# Patient Record
Sex: Female | Born: 1985 | Race: White | Hispanic: No | Smoking: Current every day smoker
Health system: Southern US, Community
[De-identification: ages and names within clinical notes are randomized; demographics above are authoritative.]

## PROBLEM LIST (undated history)

## (undated) DIAGNOSIS — E7800 Pure hypercholesterolemia, unspecified: Secondary | ICD-10-CM

## (undated) DIAGNOSIS — F32A Depression, unspecified: Secondary | ICD-10-CM

## (undated) DIAGNOSIS — F419 Anxiety disorder, unspecified: Secondary | ICD-10-CM

## (undated) DIAGNOSIS — G43909 Migraine, unspecified, not intractable, without status migrainosus: Secondary | ICD-10-CM

## (undated) DIAGNOSIS — R251 Tremor, unspecified: Secondary | ICD-10-CM

## (undated) HISTORY — DX: Migraine, unspecified, not intractable, without status migrainosus: G43.909

## (undated) HISTORY — DX: Anxiety disorder, unspecified: F41.9

## (undated) HISTORY — PX: TUBAL LIGATION: SHX77

## (undated) HISTORY — DX: Pure hypercholesterolemia, unspecified: E78.00

## (undated) HISTORY — DX: Depression, unspecified: F32.A

## (undated) HISTORY — PX: TONSILLECTOMY: SUR1361

## (undated) HISTORY — DX: Tremor, unspecified: R25.1

## (undated) HISTORY — PX: CHOLECYSTECTOMY: SHX55

---
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
## 2010-01-14 ENCOUNTER — Observation Stay (HOSPITAL_COMMUNITY): Admission: EM | Admit: 2010-01-14 | Discharge: 2010-01-14 | Payer: Self-pay | Admitting: Emergency Medicine

## 2010-01-22 ENCOUNTER — Emergency Department (HOSPITAL_COMMUNITY): Admission: EM | Admit: 2010-01-22 | Discharge: 2009-12-23 | Payer: Self-pay | Admitting: Emergency Medicine

## 2010-04-28 LAB — COMPREHENSIVE METABOLIC PANEL
Albumin: 2.8 g/dL — ABNORMAL LOW (ref 3.5–5.2)
Alkaline Phosphatase: 52 U/L (ref 39–117)
BUN: 5 mg/dL — ABNORMAL LOW (ref 6–23)
Creatinine, Ser: 0.85 mg/dL (ref 0.4–1.2)
GFR calc Af Amer: >60 mL/min (ref >60)
Potassium: 3.6 mEq/L (ref 3.5–5.1)
Total Protein: 6.8 g/dL (ref 6.0–8.3)

## 2010-04-28 LAB — URINE CULTURE
Colony Count: NO GROWTH
Culture  Setup Time: 201111301700
Culture: NO GROWTH

## 2010-04-28 LAB — URINALYSIS, ROUTINE W REFLEX MICROSCOPIC
Hgb urine dipstick: NEGATIVE
Nitrite: NEGATIVE
Protein, ur: 30 mg/dL — AB
Specific Gravity, Urine: 1.02 (ref 1.005–1.030)
Urobilinogen, UA: 1 mg/dL (ref 0.0–1.0)

## 2010-04-28 LAB — URINE MICROSCOPIC-ADD ON

## 2010-04-28 LAB — CBC
MCV: 88.5 fL (ref 78.0–100.0)
Platelets: 155 10*3/uL (ref 150–400)
RDW: 13.5 % (ref 11.5–15.5)
WBC: 9.6 10*3/uL (ref 4.0–10.5)

## 2010-04-28 LAB — DIFFERENTIAL
Basophils Relative: 4 % — ABNORMAL HIGH (ref 0–1)
Eosinophils Relative: 0 % (ref 0–5)
Lymphocytes Relative: 61 % — ABNORMAL HIGH (ref 12–46)
Neutrophils Relative %: 23 % — ABNORMAL LOW (ref 43–77)

## 2010-04-28 LAB — POCT PREGNANCY, URINE
Preg Test, Ur: NEGATIVE
Preg Test, Ur: NEGATIVE

## 2010-08-11 ENCOUNTER — Emergency Department (HOSPITAL_COMMUNITY)
Admission: EM | Admit: 2010-08-11 | Discharge: 2010-08-12 | Disposition: A | Discharge status: Home / Self Care | Payer: Self-pay | Attending: Emergency Medicine | Admitting: Emergency Medicine

## 2010-08-11 DIAGNOSIS — N949 Unspecified condition associated with female genital organs and menstrual cycle: Secondary | ICD-10-CM | POA: Insufficient documentation

## 2010-08-11 DIAGNOSIS — N938 Other specified abnormal uterine and vaginal bleeding: Secondary | ICD-10-CM | POA: Insufficient documentation

## 2010-08-11 DIAGNOSIS — Z9889 Other specified postprocedural states: Secondary | ICD-10-CM | POA: Insufficient documentation

## 2010-08-11 DIAGNOSIS — R109 Unspecified abdominal pain: Secondary | ICD-10-CM | POA: Insufficient documentation

## 2010-08-11 DIAGNOSIS — Z9089 Acquired absence of other organs: Secondary | ICD-10-CM | POA: Insufficient documentation

## 2010-08-11 LAB — WET PREP, GENITAL: Yeast Wet Prep HPF POC: NONE SEEN

## 2010-08-11 LAB — DIFFERENTIAL
Basophils Absolute: 0 K/uL (ref 0.0–0.1)
Basophils Relative: 1 % (ref 0–1)
Eosinophils Absolute: 0.2 K/uL (ref 0.0–0.7)
Eosinophils Relative: 3 % (ref 0–5)
Lymphocytes Relative: 48 % — ABNORMAL HIGH (ref 12–46)
Lymphs Abs: 3.1 K/uL (ref 0.7–4.0)
Monocytes Absolute: 0.4 K/uL (ref 0.1–1.0)
Monocytes Relative: 6 % (ref 3–12)
Neutro Abs: 2.8 K/uL (ref 1.7–7.7)
Neutrophils Relative %: 42 % — ABNORMAL LOW (ref 43–77)

## 2010-08-11 LAB — CBC
MCH: 31 pg (ref 26.0–34.0)
Platelets: 202 10*3/uL (ref 150–400)
RBC: 3.9 MIL/uL (ref 3.87–5.11)

## 2010-08-12 LAB — URINALYSIS, ROUTINE W REFLEX MICROSCOPIC
Bilirubin Urine: NEGATIVE
Specific Gravity, Urine: 1.012 (ref 1.005–1.030)
Urobilinogen, UA: 0.2 mg/dL (ref 0.0–1.0)
pH: 7 (ref 5.0–8.0)

## 2010-08-12 LAB — URINE MICROSCOPIC-ADD ON

## 2010-08-12 LAB — GC/CHLAMYDIA PROBE AMP, GENITAL
Chlamydia, DNA Probe: NEGATIVE
GC Probe Amp, Genital: NEGATIVE

## 2011-01-22 ENCOUNTER — Encounter: Payer: Self-pay | Admitting: Emergency Medicine

## 2011-01-22 ENCOUNTER — Emergency Department (HOSPITAL_COMMUNITY)
Admission: EM | Admit: 2011-01-22 | Discharge: 2011-01-22 | Disposition: A | Discharge status: Home / Self Care | Payer: Self-pay | Attending: Emergency Medicine | Admitting: Emergency Medicine

## 2011-01-22 ENCOUNTER — Emergency Department (HOSPITAL_COMMUNITY): Payer: Self-pay

## 2011-01-22 DIAGNOSIS — F17200 Nicotine dependence, unspecified, uncomplicated: Secondary | ICD-10-CM | POA: Insufficient documentation

## 2011-01-22 DIAGNOSIS — J111 Influenza due to unidentified influenza virus with other respiratory manifestations: Secondary | ICD-10-CM | POA: Insufficient documentation

## 2011-01-22 MED ORDER — ALBUTEROL SULFATE HFA 108 (90 BASE) MCG/ACT IN AERS
2.0000 | INHALATION_SPRAY | RESPIRATORY_TRACT | Status: DC | PRN
  Administered 2011-01-22: 2 via RESPIRATORY_TRACT
  Filled 2011-01-22: qty 6.7

## 2011-01-22 NOTE — ED Notes (Signed)
Pt presenting to ed with c/o flu like symptoms x 1 week with positive nausea and vomiting pt states she woke up with a fever this am

## 2011-01-22 NOTE — ED Provider Notes (Signed)
History     CSN: [id number] Arrival date & time: 01/22/2011  6:28 PM   First MD Initiated Contact with Patient 01/22/11 2033      Chief Complaint  Patient presents with  . Influenza    (Consider location/radiation/quality/duration/timing/severity/associated sxs/prior treatment) Patient is a 25 y.o. female presenting with flu symptoms and cough. The history is provided by the patient.  Influenza This is a new problem. The current episode started more than 1 week ago. The problem occurs constantly. The problem has been gradually worsening. Associated symptoms include chest pain and headaches. Pertinent negatives include no abdominal pain and no shortness of breath. Associated symptoms comments: Wheezing, fever. The symptoms are aggravated by nothing. The symptoms are relieved by nothing. She has tried acetaminophen for the symptoms. The treatment provided no relief.  Cough This is a new problem. The current episode started more than 1 week ago. The problem occurs constantly. The problem has not changed since onset.The cough is productive of sputum. The maximum temperature recorded prior to her arrival was 102 to 102.9 F. The fever has been present for 1 to 2 days. Associated symptoms include chest pain, chills, sweats, headaches, rhinorrhea, sore throat, myalgias and wheezing. Pertinent negatives include no ear pain and no shortness of breath. She has tried decongestants and cough syrup for the symptoms. The treatment provided no relief. She is a smoker. Her past medical history does not include COPD or asthma.    History reviewed. No pertinent past medical history.  Past Surgical History  Procedure Date  . Cholecystectomy   . Tubal ligation     No family history on file.  History  Substance Use Topics  . Smoking status: Current Everyday Smoker -- 1.0 packs/day  . Smokeless tobacco: Not on file  . Alcohol Use: Yes     occasionally    OB History    Grav Para Term Preterm  Abortions TAB SAB Ect Mult Living                  Review of Systems  Constitutional: Positive for chills.  HENT: Positive for sore throat and rhinorrhea. Negative for ear pain.   Respiratory: Positive for cough and wheezing. Negative for shortness of breath.   Cardiovascular: Positive for chest pain.  Gastrointestinal: Negative for abdominal pain.  Musculoskeletal: Positive for myalgias.  Neurological: Positive for headaches.  All other systems reviewed and are negative.    Allergies  Review of patient's allergies indicates no known allergies.  Home Medications   Current Outpatient Rx  Name Route Sig Dispense Refill  . AMITRIPTYLINE HCL 25 MG PO TABS Oral Take 25 mg by mouth at bedtime as needed. FOR SLEEP       BP 115/89  Pulse 91  Temp(Src) 99.7 F (37.6 C) (Oral)  Resp 20  SpO2 100%  LMP 01/08/2011  Physical Exam  Nursing note and vitals reviewed. Constitutional: She is oriented to person, place, and time. She appears well-developed and well-nourished. No distress.  HENT:  Head: Normocephalic and atraumatic.  Right Ear: Tympanic membrane, external ear and ear canal normal.  Left Ear: Tympanic membrane, external ear and ear canal normal.  Nose: Mucosal edema and rhinorrhea present.  Mouth/Throat: Posterior oropharyngeal erythema present. No oropharyngeal exudate or tonsillar abscesses.  Eyes: EOM are normal. Pupils are equal, round, and reactive to light.  Neck: Normal range of motion. Neck supple.  Cardiovascular: Normal rate, regular rhythm, normal heart sounds and intact distal pulses.  Exam reveals no friction  rub.   No murmur heard. Pulmonary/Chest: Effort normal. She has no wheezes. She has rhonchi in the right middle field and the left middle field. She has no rales. She exhibits tenderness.  Abdominal: Soft. Bowel sounds are normal. She exhibits no distension. There is no tenderness. There is no rebound and no guarding.  Musculoskeletal: Normal range of  motion. She exhibits no tenderness.       No edema  Neurological: She is alert and oriented to person, place, and time. No cranial nerve deficit.  Skin: Skin is warm and dry. No rash noted.  Psychiatric: She has a normal mood and affect. Her behavior is normal.    ED Course  Procedures (including critical care time)  Labs Reviewed - No data to display Dg Chest 2 View  01/22/2011  *RADIOLOGY REPORT*  Clinical Data: Cough, fever  CHEST - 2 VIEW  Comparison: 01/14/2010  Findings: Lungs are clear. No pleural effusion or pneumothorax.  Cardiomediastinal silhouette is within normal limits.  Visualized osseous structures are within normal limits.  Cholecystectomy clips.  IMPRESSION: Normal chest radiographs.  Original Report Authenticated By: Charline Bills, M.D.     No diagnosis found.    MDM   Pt with symptoms consistent with influenza.  Normal exam here but is febrile.  No signs of breathing difficulty  No signs of strep pharyngitis, otitis or abnormal abdominal findings.  States wheezing at night and is a smoker.   CXR wnl.  Will continue antipyretica and rest and fluids and return for any further problems.  Will give inhaler for coughing and wheezing.         Gwyneth Sprout, MD 01/22/11 2159

## 2011-10-30 ENCOUNTER — Encounter (HOSPITAL_COMMUNITY): Payer: Self-pay | Admitting: Emergency Medicine

## 2011-10-30 ENCOUNTER — Emergency Department (HOSPITAL_COMMUNITY)
Admission: EM | Admit: 2011-10-30 | Discharge: 2011-10-30 | Disposition: A | Discharge status: Home / Self Care | Payer: Self-pay | Attending: Emergency Medicine | Admitting: Emergency Medicine

## 2011-10-30 DIAGNOSIS — G43909 Migraine, unspecified, not intractable, without status migrainosus: Secondary | ICD-10-CM

## 2011-10-30 DIAGNOSIS — F17200 Nicotine dependence, unspecified, uncomplicated: Secondary | ICD-10-CM | POA: Insufficient documentation

## 2011-10-30 MED ORDER — KETOROLAC TROMETHAMINE 60 MG/2ML IM SOLN
60.0000 mg | Freq: Once | INTRAMUSCULAR | Status: AC
  Administered 2011-10-30: 60 mg via INTRAMUSCULAR
  Filled 2011-10-30: qty 2

## 2011-10-30 MED ORDER — METOCLOPRAMIDE HCL 5 MG/ML IJ SOLN
10.0000 mg | Freq: Once | INTRAMUSCULAR | Status: AC
  Administered 2011-10-30: 10 mg via INTRAMUSCULAR
  Filled 2011-10-30: qty 2

## 2011-10-30 MED ORDER — DEXAMETHASONE SODIUM PHOSPHATE 10 MG/ML IJ SOLN
10.0000 mg | Freq: Once | INTRAMUSCULAR | Status: AC
  Administered 2011-10-30: 10 mg via INTRAMUSCULAR
  Filled 2011-10-30: qty 1

## 2011-10-30 MED ORDER — DIPHENHYDRAMINE HCL 50 MG/ML IJ SOLN
25.0000 mg | Freq: Once | INTRAMUSCULAR | Status: AC
  Administered 2011-10-30: 25 mg via INTRAMUSCULAR
  Filled 2011-10-30: qty 1

## 2011-10-30 NOTE — ED Notes (Signed)
Patient given discharge instructions, information, prescriptions, and diet order. Patient states that they adequately understand discharge information given and to return to ED if symptoms return or worsen.     

## 2011-10-30 NOTE — ED Notes (Signed)
Pt presents w/ migraine for previous week, sharp stabbing pain behind left eye. Rx/ed w/ Excedrin w/o relief. States has vision loss for 3 days during the week could see only white. spots

## 2011-10-30 NOTE — ED Notes (Signed)
Pt sts pain x 1 week with migraine, sts she get migraines every other week since 15. Pt sts she has no medication that works for her. Pain level 8/10. Pt seeing white spots in left eye at this time.

## 2011-10-30 NOTE — ED Provider Notes (Signed)
History     CSN: [id number]  Arrival date & time 10/30/11  1437   First MD Initiated Contact with Patient 10/30/11 1507      Chief Complaint  Patient presents with  . Migraine    (Consider location/radiation/quality/duration/timing/severity/associated sxs/prior treatment) Patient is a 26 y.o. female presenting with migraines. The history is provided by the patient.  Migraine   Pt with her typical migraine x 1 week--no fever or neck pain--pt using otc meds wihout relief--no change in gait, notes visual scotomas--pain worse with sound--no follow up  History reviewed. No pertinent past medical history.  Past Surgical History  Procedure Date  . Tubal ligation   . Cholecystectomy   . Tonsillectomy     No family history on file.  History  Substance Use Topics  . Smoking status: Current Every Day Smoker -- 1.0 packs/day    Types: Cigarettes  . Smokeless tobacco: Never Used  . Alcohol Use:     OB History    Grav Para Term Preterm Abortions TAB SAB Ect Mult Living                  Review of Systems  All other systems reviewed and are negative.    Allergies  Review of patient's allergies indicates no known allergies.  Home Medications   Current Outpatient Rx  Name Route Sig Dispense Refill  . ASPIRIN-ACETAMINOPHEN-CAFFEINE 250-250-65 MG PO TABS Oral Take 1 tablet by mouth every 6 (six) hours as needed. Migraine    . RA GUMMY VITAMINS & MINERALS PO CHEW Oral Chew 1 tablet by mouth daily.      BP 106/48  Pulse 70  Temp 97.8 F (36.6 C) (Oral)  Resp 16  Ht 6' (1.829 m)  SpO2 100%  LMP 10/17/2011  Physical Exam  Nursing note and vitals reviewed. Constitutional: She is oriented to person, place, and time. She appears well-developed and well-nourished.  Non-toxic appearance. No distress.  HENT:  Head: Normocephalic and atraumatic.  Eyes: Conjunctivae normal, EOM and lids are normal. Pupils are equal, round, and reactive to light.  Neck: Normal range of  motion. Neck supple. No tracheal deviation present. No mass present.  Cardiovascular: Normal rate, regular rhythm and normal heart sounds.  Exam reveals no gallop.   No murmur heard. Pulmonary/Chest: Effort normal and breath sounds normal. No stridor. No respiratory distress. She has no decreased breath sounds. She has no wheezes. She has no rhonchi. She has no rales.  Abdominal: Soft. Normal appearance and bowel sounds are normal. She exhibits no distension. There is no tenderness. There is no rebound and no CVA tenderness.  Musculoskeletal: Normal range of motion. She exhibits no edema and no tenderness.  Neurological: She is alert and oriented to person, place, and time. She has normal strength. No cranial nerve deficit or sensory deficit. GCS eye subscore is 4. GCS verbal subscore is 5. GCS motor subscore is 6.  Skin: Skin is warm and dry. No abrasion and no rash noted.  Psychiatric: She has a normal mood and affect. Her speech is normal and behavior is normal.    ED Course  Procedures (including critical care time)  Labs Reviewed - No data to display No results found.   No diagnosis found.    MDM  Pt given meds for pain, and feels better--neuro exam stable        Toy Baker, MD 10/30/11 [phone]

## 2015-11-20 ENCOUNTER — Encounter (HOSPITAL_COMMUNITY): Payer: Self-pay | Admitting: Emergency Medicine

## 2018-05-15 ENCOUNTER — Encounter (HOSPITAL_COMMUNITY): Payer: Self-pay | Admitting: Emergency Medicine

## 2018-05-15 ENCOUNTER — Emergency Department (HOSPITAL_COMMUNITY): Payer: Self-pay

## 2018-05-15 ENCOUNTER — Other Ambulatory Visit: Payer: Self-pay

## 2018-05-15 ENCOUNTER — Emergency Department (HOSPITAL_COMMUNITY)
Admission: EM | Admit: 2018-05-15 | Discharge: 2018-05-15 | Disposition: A | Discharge status: Home / Self Care | Payer: Self-pay | Attending: Emergency Medicine | Admitting: Emergency Medicine

## 2018-05-15 DIAGNOSIS — W502XXA Accidental twist by another person, initial encounter: Secondary | ICD-10-CM | POA: Insufficient documentation

## 2018-05-15 DIAGNOSIS — Y9301 Activity, walking, marching and hiking: Secondary | ICD-10-CM | POA: Insufficient documentation

## 2018-05-15 DIAGNOSIS — Y92007 Garden or yard of unspecified non-institutional (private) residence as the place of occurrence of the external cause: Secondary | ICD-10-CM | POA: Insufficient documentation

## 2018-05-15 DIAGNOSIS — S99912A Unspecified injury of left ankle, initial encounter: Secondary | ICD-10-CM | POA: Insufficient documentation

## 2018-05-15 DIAGNOSIS — Y999 Unspecified external cause status: Secondary | ICD-10-CM | POA: Insufficient documentation

## 2018-05-15 DIAGNOSIS — F17210 Nicotine dependence, cigarettes, uncomplicated: Secondary | ICD-10-CM | POA: Insufficient documentation

## 2018-05-15 MED ORDER — OXYCODONE-ACETAMINOPHEN 5-325 MG PO TABS
1.0000 | ORAL_TABLET | Freq: Once | ORAL | Status: AC
  Administered 2018-05-15: 1 via ORAL
  Filled 2018-05-15: qty 1

## 2018-05-15 NOTE — ED Provider Notes (Signed)
[facility]-EMERGENCY DEPT Provider Note   CSN: [id number] Arrival date & time: 05/15/18  1637    History   Chief Complaint Chief Complaint  Patient presents with  . Foot Injury    HPI Tracy Garcia is a 33 y.o. female without significant past medical history, presenting to the emergency department with acute onset of left foot and ankle pain that began a few hours prior to arrival.  Patient states she rolled her ankle after stepping on a gumball  from a tree outside.  She has pain to the lateral aspect of her ankle and dorsal aspect of her foot.  Pain is worse with palpation and movement.  She elevated her foot and applied ice without any relief.  Did not take any over-the-counter medications for symptoms.  No previous injuries to this foot.     The history is provided by the patient.    History reviewed. No pertinent past medical history.  There are no active problems to display for this patient.   Past Surgical History:  Procedure Laterality Date  . CHOLECYSTECTOMY    . TONSILLECTOMY    . TUBAL LIGATION       OB History   No obstetric history on file.      Home Medications    Prior to Admission medications   Medication Sig Start Date End Date Taking? Authorizing Provider  Acetaminophen-Caff-Pyrilamine (MIDOL COMPLETE PO) Take 2 tablets by mouth daily as needed (menstrual cramps.).   Yes [provider]  aspirin-acetaminophen-caffeine (EXCEDRIN MIGRAINE) [phone] MG per tablet Take 1 tablet by mouth every 6 (six) hours as needed. Migraine   Yes [provider]    Family History No family history on file.  Social History Social History   Tobacco Use  . Smoking status: Current Every Day Smoker    Packs/day: 1.00    Types: Cigarettes  . Smokeless tobacco: Never Used  Substance Use Topics  . Alcohol use: Yes    Comment: occasionally  . Drug use: Yes    Types: Marijuana    Comment: not very often     Allergies    Patient has no known allergies.   Review of Systems Review of Systems  Musculoskeletal: Positive for arthralgias and joint swelling.  Skin: Negative for wound.     Physical Exam Updated Vital Signs BP 111/76 (BP Location: Right Arm)   Pulse 83   Temp 99 F (37.2 C) (Oral)   Resp 18   Ht 6' (1.829 m)   LMP 05/11/2018   SpO2 96%   Physical Exam Vitals signs and nursing note reviewed.  Constitutional:      General: She is not in acute distress.    Appearance: She is well-developed.  HENT:     Head: Normocephalic and atraumatic.  Eyes:     Conjunctiva/sclera: Conjunctivae normal.  Cardiovascular:     Rate and Rhythm: Normal rate.  Pulmonary:     Effort: Pulmonary effort is normal.  Musculoskeletal:     Comments: Left foot and ankle with mild swelling.  No obvious deformity or color changes.  No wounds.  Normal sensation.  There is tenderness to the lateral aspect of the foot, anterior to the lateral malleolus, as well as over the metatarsals aspect of the dorsal foot.  Pain with range of motion of the toes and ankle  Neurological:     Mental Status: She is alert.  Psychiatric:        Mood and Affect: Mood normal.  Behavior: Behavior normal.      ED Treatments / Results  Labs (all labs ordered are listed, but only abnormal results are displayed) Labs Reviewed - No data to display  EKG None  Radiology Dg Ankle Complete Left  Result Date: 05/15/2018 CLINICAL DATA:  33 year old female with trauma to the left ankle. EXAM: LEFT ANKLE COMPLETE - 3+ VIEW COMPARISON:  Left foot radiograph dated 05/15/2018 FINDINGS: There is no evidence of fracture, dislocation, or joint effusion. There is no evidence of arthropathy or other focal bone abnormality. Soft tissues are unremarkable. IMPRESSION: Negative. Electronically Signed   By: Elgie Collard M.D.   On: 05/15/2018 19:13   Dg Foot Complete Left  Result Date: 05/15/2018 CLINICAL DATA:  Fall. EXAM: LEFT FOOT -  COMPLETE 3+ VIEW COMPARISON:  None. FINDINGS: No acute fracture or dislocation. Joint spaces are preserved. Bone mineralization is normal. Dorsal forefoot soft tissue swelling. IMPRESSION: 1. Dorsal forefoot soft tissue swelling. No acute osseous abnormality. Electronically Signed   By: Obie Dredge M.D.   On: 05/15/2018 18:00    Procedures Procedures (including critical care time)  Medications Ordered in ED Medications  oxyCODONE-acetaminophen (PERCOCET/ROXICET) 5-325 MG per tablet 1 tablet (1 tablet Oral Given 05/15/18 1744)     Initial Impression / Assessment and Plan / ED Course  I have reviewed the triage vital signs and the nursing notes.  Pertinent labs & imaging results that were available during my care of the patient were reviewed by me and considered in my medical decision making (see chart for details).        Patient presenting with left foot and ankle pain after rolling it earlier today.  Some mild swelling and tenderness noted.  No obvious deformity.  X-ray of foot and ankle are negative for acute fracture.  Will treat as likely sprain.  ASO brace and crutches provided.  Discussed R ICE therapy, NSAIDs, nonweightbearing for at least 1 week.  Ortho referral provided for follow-up as needed.  Patient agreeable to plan and safe for discharge.  Discussed results, findings, treatment and follow up. Patient advised of return precautions. Patient verbalized understanding and agreed with plan.  Final Clinical Impressions(s) / ED Diagnoses   Final diagnoses:  Left ankle injury, initial encounter    ED Discharge Orders    None       Jospeh Mangel, [place] N, PA-C 05/15/18 2246    Tegeler, Canary Brim, MD 05/15/18 [phone]

## 2018-05-15 NOTE — Discharge Instructions (Signed)
Please read instructions below. Apply ice to your ankle for 20 minutes at a time. Keep it elevated as much as possible. After 1 week you can try to weight bear at tolerated.  You can take ibuprofen every 6 hours as needed for pain. Schedule an appointment with the orthopedic specialist in 1-2 weeks if your symptoms do not improve.  Return to the ER for new or concerning symptoms.

## 2018-05-15 NOTE — ED Triage Notes (Signed)
Pt reports that she was out helping neighbor with yard work when she slipped on tree gum balls and twisted and landed on her left foot. Pt had it elevated and iced at home for past two hours but pain and swelling continued.

## 2019-04-09 ENCOUNTER — Emergency Department (HOSPITAL_COMMUNITY): Payer: Self-pay

## 2019-04-09 ENCOUNTER — Emergency Department (HOSPITAL_COMMUNITY)
Admission: EM | Admit: 2019-04-09 | Discharge: 2019-04-09 | Disposition: A | Discharge status: Home / Self Care | Payer: Self-pay | Attending: Emergency Medicine | Admitting: Emergency Medicine

## 2019-04-09 ENCOUNTER — Other Ambulatory Visit: Payer: Self-pay

## 2019-04-09 ENCOUNTER — Encounter (HOSPITAL_COMMUNITY): Payer: Self-pay | Admitting: Emergency Medicine

## 2019-04-09 DIAGNOSIS — Z79899 Other long term (current) drug therapy: Secondary | ICD-10-CM | POA: Insufficient documentation

## 2019-04-09 DIAGNOSIS — M5441 Lumbago with sciatica, right side: Secondary | ICD-10-CM | POA: Insufficient documentation

## 2019-04-09 DIAGNOSIS — F17210 Nicotine dependence, cigarettes, uncomplicated: Secondary | ICD-10-CM | POA: Insufficient documentation

## 2019-04-09 DIAGNOSIS — Z8739 Personal history of other diseases of the musculoskeletal system and connective tissue: Secondary | ICD-10-CM | POA: Insufficient documentation

## 2019-04-09 DIAGNOSIS — M5440 Lumbago with sciatica, unspecified side: Secondary | ICD-10-CM

## 2019-04-09 MED ORDER — METHOCARBAMOL 500 MG PO TABS: 500.0000 mg | ORAL_TABLET | Freq: Two times a day (BID) | ORAL | 0 refills | Status: AC

## 2019-04-09 MED ORDER — KETOROLAC TROMETHAMINE 15 MG/ML IJ SOLN
15.0000 mg | Freq: Once | INTRAMUSCULAR | Status: AC
  Administered 2019-04-09: 13:00:00 15 mg via INTRAMUSCULAR
  Filled 2019-04-09: qty 1

## 2019-04-09 MED ORDER — ACETAMINOPHEN 500 MG PO TABS
1000.0000 mg | ORAL_TABLET | Freq: Once | ORAL | Status: AC
  Administered 2019-04-09: 1000 mg via ORAL
  Filled 2019-04-09: qty 2

## 2019-04-09 MED ORDER — PREDNISONE 20 MG PO TABS
40.0000 mg | ORAL_TABLET | Freq: Every day | ORAL | 0 refills | Status: AC
Start: 2019-04-09 — End: 2019-04-14

## 2019-04-09 MED ORDER — DIAZEPAM 5 MG PO TABS
5.0000 mg | ORAL_TABLET | Freq: Once | ORAL | Status: AC
  Administered 2019-04-09: 13:00:00 5 mg via ORAL
  Filled 2019-04-09: qty 1

## 2019-04-09 MED ORDER — KETOROLAC TROMETHAMINE 15 MG/ML IJ SOLN: 15.0000 mg | Freq: Once | INTRAMUSCULAR | Status: DC

## 2019-04-09 NOTE — Discharge Instructions (Signed)
Your xray today did not show any acute findings.   I have prescribed muscle relaxers to help with your pain. Please be aware this medication can cause drowsiness, do not drink alcohol or drive while taking this medication.

## 2019-04-09 NOTE — ED Provider Notes (Signed)
[facility] EMERGENCY DEPARTMENT Provider Note   CSN: [id number] Arrival date & time: 04/09/19  1151     History Chief Complaint  Patient presents with  . Back Pain    Tracy Garcia is a 34 y.o. female.  34 y.o female with a PMH of herniated disc presents to the ED with a chief complaint of low back pain x Saturday. Patient reports being at a friends house and attempting to stand up when she felt a pulling sensation to her lower back upon standing, she reports feeling her legs give up from under her. She describes the pain as a pulling sensation located to the lower back with radiation down her right leg. She has tried ibuprofen and goody powders without improvement. She denies any bowel or bladder complaints. No dysuria, no fever, no saddle anesthesia or prior history of cancer.   The history is provided by the patient.  Back Pain Associated symptoms: no abdominal pain, no chest pain, no fever, no numbness and no weakness        History reviewed. No pertinent past medical history.  There are no problems to display for this patient.   Past Surgical History:  Procedure Laterality Date  . CHOLECYSTECTOMY    . TONSILLECTOMY    . TUBAL LIGATION       OB History   No obstetric history on file.     No family history on file.  Social History   Tobacco Use  . Smoking status: Current Every Day Smoker    Packs/day: 1.00    Types: Cigarettes  . Smokeless tobacco: Never Used  Substance Use Topics  . Alcohol use: Yes    Comment: occasionally  . Drug use: Yes    Types: Marijuana    Comment: not very often    Home Medications Prior to Admission medications   Medication Sig Start Date End Date Taking? Authorizing Provider  Acetaminophen-Caff-Pyrilamine (MIDOL COMPLETE PO) Take 2 tablets by mouth daily as needed (menstrual cramps.).    [provider]  aspirin-acetaminophen-caffeine (EXCEDRIN MIGRAINE) [phone] MG per tablet Take 1 tablet by  mouth every 6 (six) hours as needed. Migraine    [provider]  methocarbamol (ROBAXIN) 500 MG tablet Take 1 tablet (500 mg total) by mouth 2 (two) times daily for 7 days. 04/09/19 04/16/19  Claude Manges, PA-C  predniSONE (DELTASONE) 20 MG tablet Take 2 tablets (40 mg total) by mouth daily for 5 days. 04/09/19 04/14/19  Claude Manges, PA-C    Allergies    Patient has no known allergies.  Review of Systems   Review of Systems  Constitutional: Negative for fever.  HENT: Negative for sore throat.   Respiratory: Negative for shortness of breath.   Cardiovascular: Negative for chest pain.  Gastrointestinal: Negative for abdominal pain, nausea and vomiting.  Genitourinary: Negative for flank pain.  Musculoskeletal: Positive for back pain and myalgias.  Skin: Negative for wound.  Neurological: Negative for syncope, weakness, light-headedness and numbness.  All other systems reviewed and are negative.   Physical Exam Updated Vital Signs BP 100/70   Pulse 86   Temp 98.2 F (36.8 C) (Oral)   Resp 16   Ht 6' (1.829 m)   Wt 117.9 kg   LMP 03/30/2019   SpO2 99%   BMI 35.26 kg/m   Physical Exam Vitals and nursing note reviewed.  Constitutional:      General: She is not in acute distress.    Appearance: She is well-developed.  HENT:     Head: Normocephalic and atraumatic.     Mouth/Throat:     Pharynx: No oropharyngeal exudate.  Eyes:     Pupils: Pupils are equal, round, and reactive to light.  Cardiovascular:     Rate and Rhythm: Regular rhythm.     Heart sounds: Normal heart sounds.  Pulmonary:     Effort: Pulmonary effort is normal. No respiratory distress.     Breath sounds: Normal breath sounds.  Abdominal:     General: Bowel sounds are normal. There is no distension.     Palpations: Abdomen is soft.     Tenderness: There is no abdominal tenderness.  Musculoskeletal:        General: No tenderness or deformity.     Cervical back: Normal range of motion.     Right  lower leg: No edema.     Left lower leg: No edema.  Skin:    General: Skin is warm and dry.  Neurological:     Mental Status: She is alert and oriented to person, place, and time.     Comments: RLE- KF,KE 5/5 strength LLE- HF, HE 5/5 strength Antalgic gait. No pronator drift. No leg drop.  Patellar reflexes present and symmetric. CN I, II and VIII not tested. CN II-XII grossly intact bilaterally.       ED Results / Procedures / Treatments   Labs (all labs ordered are listed, but only abnormal results are displayed) Labs Reviewed - No data to display  EKG None  Radiology DG Lumbar Spine Complete  Result Date: 04/09/2019 CLINICAL DATA:  Low back pain after lifting heavy object EXAM: LUMBAR SPINE - COMPLETE 4+ VIEW COMPARISON:  None. FINDINGS: Frontal, lateral, spot lumbosacral lateral, and bilateral oblique views were obtained. There is no fracture or spondylolisthesis. Disc spaces appear unremarkable. No appreciable facet arthropathy. IMPRESSION: No fracture or spondylolisthesis.  No evident arthropathy. Electronically Signed   By: Bretta Bang III M.D.   On: 04/09/2019 14:47    Procedures Procedures (including critical care time)  Medications Ordered in ED Medications  acetaminophen (TYLENOL) tablet 1,000 mg (1,000 mg Oral Given 04/09/19 1319)  diazepam (VALIUM) tablet 5 mg (5 mg Oral Given 04/09/19 1319)  ketorolac (TORADOL) 15 MG/ML injection 15 mg (15 mg Intramuscular Given 04/09/19 1319)    ED Course  I have reviewed the triage vital signs and the nursing notes.  Pertinent labs & imaging results that were available during my care of the patient were reviewed by me and considered in my medical decision making (see chart for details).    MDM Rules/Calculators/A&P     Patient with a past medical history of herniated disc presents to the ED with lower back pain since Saturday, reports this pain began upon standing from the couch, described as a pulling sensation to  her lower back with radiation down to her right leg.  No red flags such as fever, urinary retention, bowel incontinence, saddle esthesia, prior history of cancer.  Has taken over-the-counter medication without improvement.  She does have antalgic gait but otherwise is hemodynamically stable, overall well-appearing.  Patient provided with symptomatic control.  Xray of her lumbar spine did not show any acute findings. These results were discussed at length with patient. She does report improvement in her symptoms with medication provided in the ED.  Low back pain without any red flags, she is non toxic, non ill appearing. No urinary symptoms, no flank pain lower suspicion for urinary component. She denies any  history of diabetes, will place patient on a short steroid burst along with muscle relaxers to help with her symptoms. Return precautions discussed at length.    Portions of this note were generated with [employer]. Dictation errors may occur despite best attempts at proofreading.  Final Clinical Impression(s) / ED Diagnoses Final diagnoses:  Acute right-sided low back pain with sciatica, sciatica laterality unspecified    Rx / DC Orders ED Discharge Orders         Ordered    methocarbamol (ROBAXIN) 500 MG tablet  2 times daily     04/09/19 1451    predniSONE (DELTASONE) 20 MG tablet  Daily     04/09/19 1451           Janeece Fitting, PA-C 04/09/19 1514    Blanchie Dessert, MD 04/10/19 [phone]

## 2019-04-09 NOTE — ED Triage Notes (Signed)
Pt reports lower back pain for a week. 2 nights ago, she went to stand up and heard a loud snap and has had trouble ambulating since. Reports numbness to right hip down.

## 2021-04-29 ENCOUNTER — Encounter: Payer: Self-pay | Admitting: *Deleted

## 2021-05-05 ENCOUNTER — Other Ambulatory Visit: Payer: Self-pay

## 2021-05-05 ENCOUNTER — Ambulatory Visit: Payer: 59 | Admitting: Diagnostic Neuroimaging

## 2021-05-05 ENCOUNTER — Encounter: Payer: Self-pay | Admitting: Diagnostic Neuroimaging

## 2021-05-05 VITALS — BP 128/78 | HR 87 | Ht 72.0 in | Wt 254.0 lb

## 2021-05-05 DIAGNOSIS — G43109 Migraine with aura, not intractable, without status migrainosus: Secondary | ICD-10-CM

## 2021-05-05 DIAGNOSIS — R251 Tremor, unspecified: Secondary | ICD-10-CM | POA: Diagnosis not present

## 2021-05-05 NOTE — Progress Notes (Signed)
? ?[facility] ? ?PATIENT: Tracy Garcia ?DOB: 1985/06/16 ? ?REFERRING CLINICIAN: Danelle Berry, NP ?HISTORY FROM: patient  ?REASON FOR VISIT: new consult  ? ? ?HISTORICAL ? ?CHIEF COMPLAINT:  ?Chief Complaint  ?Patient presents with  ? New Patient (Initial Visit)  ?  Rm 7 alone here to discuss worsening hand tremors ( on going since teenage years). She does have a history of migraines.   ? ? ?HISTORY OF PRESENT ILLNESS:  ? ?36 year old female here for evaluation of tremor and headaches.  Patient has had tremors in bilateral upper extremities fluctuating since teenage years.  Symptoms have slightly worsened recently.  Patient also having some gait and balance difficulty sometimes bumping into walls at her home.  Patient has difficult and poor quality sleep. ? ?Also having issues with migraine headaches, right-sided severe pain, eye pain and pressure, seeing spots, nausea and vomiting, sensitive to light and sound.  Was having 15-16 migraines per month.  Now on Nurtec and having 5-10 headaches per month. ? ? ? ?REVIEW OF SYSTEMS: Full 14 system review of systems performed and negative with exception of: As per HPI. ? ?ALLERGIES: ?Allergies  ?Allergen Reactions  ? Shellfish Allergy Hives  ?  Itching   ? ? ?HOME MEDICATIONS: ?Outpatient Medications Prior to Visit  ?Medication Sig Dispense Refill  ? Cholecalciferol (VITAMIN D3 PO) Take by mouth.    ? Multiple Vitamin (MULTIVITAMIN PO) Take by mouth.    ? NURTEC 75 MG TBDP Take by mouth.    ? NUVARING 0.12-0.015 MG/24HR vaginal ring Place vaginally.    ? Acetaminophen-Caff-Pyrilamine (MIDOL COMPLETE PO) Take 2 tablets by mouth daily as needed (menstrual cramps.).    ? aspirin-acetaminophen-caffeine (EXCEDRIN MIGRAINE) 250-250-65 MG per tablet Take 1 tablet by mouth every 6 (six) hours as needed. Migraine    ? ?No facility-administered medications prior to visit.  ? ? ?PAST MEDICAL HISTORY: ?Past Medical History:  ?Diagnosis Date  ? Anxiety   ?  Depression   ? High cholesterol   ? Migraine   ? Tremor of both hands   ? ? ?PAST SURGICAL HISTORY: ?Past Surgical History:  ?Procedure Laterality Date  ? CHOLECYSTECTOMY    ? TONSILLECTOMY    ? TUBAL LIGATION    ? ? ?FAMILY HISTORY: ?Family History  ?Problem Relation Age of Onset  ? Diabetes Mother   ? Diabetes Father   ? ? ?SOCIAL HISTORY: ?Social History  ? ?Socioeconomic History  ? Marital status: Significant Other  ?  Spouse name: Not on file  ? Number of children: 2  ? Years of education: Not on file  ? Highest education level: GED or equivalent  ?Occupational History  ? Not on file  ?Tobacco Use  ? Smoking status: Every Day  ?  Packs/day: 1.00  ?  Types: Cigarettes  ? Smokeless tobacco: Never  ?Substance and Sexual Activity  ? Alcohol use: Yes  ?  Comment: occasionally  ? Drug use: Yes  ?  Types: Marijuana  ?  Comment: not very often  ? Sexual activity: Yes  ?  Birth control/protection: Surgical  ?Other Topics Concern  ? Not on file  ?Social History Narrative  ? Right handed   ? Caffeine 1-2 cups per day  ?   ? Lives at [place] with fiance, mother, and mothers boyfriend   ? ?Social Determinants of Health  ? ?Financial Resource Strain: Not on file  ?Food Insecurity: Not on file  ?Transportation Needs: Not on file  ?Physical Activity: Not  on file  ?Stress: Not on file  ?Social Connections: Not on file  ?Intimate Partner Violence: Not on file  ? ? ? ?PHYSICAL EXAM ? ?GENERAL EXAM/CONSTITUTIONAL: ?Vitals:  ?Vitals:  ? 05/05/21 1346  ?BP: 128/78  ?Pulse: 87  ?Weight: 254 lb (115.2 kg)  ?Height: 6' (1.829 m)  ? ?Body mass index is 34.45 kg/m?. ?Wt Readings from Last 3 Encounters:  ?05/05/21 254 lb (115.2 kg)  ?04/09/19 260 lb (117.9 kg)  ? ?Patient is in no distress; well developed, nourished and groomed; neck is supple ? ?CARDIOVASCULAR: ?Examination of carotid arteries is normal; no carotid bruits ?Regular rate and rhythm, no murmurs ?Examination of peripheral vascular system by observation and palpation is  normal ? ?EYES: ?Ophthalmoscopic exam of optic discs and posterior segments is normal; no papilledema or hemorrhages ?No results found. ? ?MUSCULOSKELETAL: ?Gait, strength, tone, movements noted in Neurologic exam below ? ?NEUROLOGIC: ?MENTAL STATUS:  ?No flowsheet data found. ?awake, alert, oriented to person, place and time ?recent and remote memory intact ?normal attention and concentration ?language fluent, comprehension intact, naming intact ?fund of knowledge appropriate ? ?CRANIAL NERVE:  ?2nd - no papilledema on fundoscopic exam ?2nd, 3rd, 4th, 6th - pupils equal and reactive to light, visual fields full to confrontation, extraocular muscles intact, no nystagmus ?5th - facial sensation symmetric ?7th - facial strength symmetric ?8th - hearing intact ?9th - palate elevates symmetrically, uvula midline ?11th - shoulder shrug symmetric ?12th - tongue protrusion midline ? ?MOTOR:  ?normal bulk and tone, full strength in the BUE, BLE ?POSTURAL TREMOR IN BUE ? ?SENSORY:  ?normal and symmetric to light touch, temperature, vibration ? ?COORDINATION:  ?finger-nose-finger, fine finger movements normal ? ?REFLEXES:  ?deep tendon reflexes present and symmetric ? ?GAIT/STATION:  ?narrow based gait ? ? ? ?DIAGNOSTIC DATA (LABS, IMAGING, TESTING) ?- I reviewed patient records, labs, notes, testing and imaging myself where available. ? ?Lab Results  ?Component Value Date  ? WBC 6.5 08/11/2010  ? HGB 12.1 08/11/2010  ? HCT 34.2 (L) 08/11/2010  ? MCV 87.7 08/11/2010  ? PLT 202 08/11/2010  ? ?   ?Component Value Date/Time  ? NA 134 (L) 01/14/2010 1053  ? K 3.6 01/14/2010 1053  ? CL 108 01/14/2010 1053  ? CO2 22 01/14/2010 1053  ? GLUCOSE 118 (H) 01/14/2010 1053  ? BUN 5 (L) 01/14/2010 1053  ? CREATININE 0.85 01/14/2010 1053  ? CALCIUM 7.8 (L) 01/14/2010 1053  ? PROT 6.8 01/14/2010 1053  ? ALBUMIN 2.8 (L) 01/14/2010 1053  ? AST 32 01/14/2010 1053  ? ALT 25 01/14/2010 1053  ? ALKPHOS 52 01/14/2010 1053  ? BILITOT 0.3 01/14/2010  1053  ? GFRNONAA >60 01/14/2010 1053  ? GFRAA  01/14/2010 1053  ?  >60        ?The eGFR has been calculated ?using the MDRD equation. ?This calculation has not been ?validated in all clinical ?situations. ?eGFR's persistently ?<60 mL/min signify ?possible Chronic Kidney Disease.  ? ?No results found for: CHOL, HDL, LDLCALC, LDLDIRECT, TRIG, CHOLHDL ?No results found for: HGBA1C ?No results found for: VITAMINB12 ?No results found for: TSH ? ? ? ?ASSESSMENT AND PLAN ? ?36 y.o. year old female here with: ? ? ?Dx: ? ?1. Migraine with aura and without status migrainosus, not intractable   ?2. Tremor   ? ? ?PLAN: ? ?TREMOR / ATAXIA ?- MRI brain (rule out demyelinating disease) ? ?MIGRAINE WITH AURA ?- continue nurtec as needed ? ?Orders Placed This Encounter  ?Procedures  ? MR  BRAIN W WO CONTRAST  ? ?Return for pending test results, pending if symptoms worsen or fail to improve. ? ? ? ?Penni Bombard, MD 2/58/9483, 4:75 PM ?Certified in Neurology, Neurophysiology and Neuroimaging ? ?[facility] ?[address], [address] ?[place], [place] [address] ?[phone] ? ?

## 2021-05-05 NOTE — Patient Instructions (Signed)
TREMOR / ATAXIA ?- MRI brain ? ?MIGRAINE WITH AURA ?- continue nurtec as needed ?

## 2021-05-07 ENCOUNTER — Telehealth: Payer: Self-pay | Admitting: Diagnostic Neuroimaging

## 2021-05-07 NOTE — Telephone Encounter (Signed)
[facility] pending faxed notes 

## 2021-05-12 NOTE — Telephone Encounter (Signed)
spoke to the pt, due to the cost she will get back to [place] to schedule  ?[facility] auth: [id number] (exp. 05/07/21 to 08/07/21) ?

## 2021-05-27 ENCOUNTER — Ambulatory Visit: Payer: 59 | Admitting: Diagnostic Neuroimaging

## 2021-07-22 ENCOUNTER — Other Ambulatory Visit: Payer: Self-pay | Admitting: Nurse Practitioner

## 2021-07-22 DIAGNOSIS — M79604 Pain in right leg: Secondary | ICD-10-CM

## 2021-07-29 ENCOUNTER — Ambulatory Visit
Admission: RE | Admit: 2021-07-29 | Discharge: 2021-07-29 | Disposition: A | Discharge status: Home / Self Care | Payer: 59 | Source: Ambulatory Visit | Attending: Nurse Practitioner | Admitting: Nurse Practitioner

## 2021-07-29 DIAGNOSIS — R079 Chest pain, unspecified: Secondary | ICD-10-CM

## 2021-07-29 DIAGNOSIS — M79604 Pain in right leg: Secondary | ICD-10-CM | POA: Diagnosis present

## 2021-07-29 DIAGNOSIS — R000 Tachycardia, unspecified: Secondary | ICD-10-CM

## 2021-07-29 DIAGNOSIS — I82409 Acute embolism and thrombosis of unspecified deep veins of unspecified lower extremity: Secondary | ICD-10-CM

## 2021-07-29 HISTORY — DX: Acute embolism and thrombosis of unspecified deep veins of unspecified lower extremity: I82.409

## 2021-07-30 ENCOUNTER — Inpatient Hospital Stay (HOSPITAL_COMMUNITY)
Admission: EM | Admit: 2021-07-30 | Discharge: 2021-08-10 | DRG: 163 | Disposition: A | Discharge status: Home / Self Care | Payer: 59 | Attending: Internal Medicine | Admitting: Internal Medicine

## 2021-07-30 ENCOUNTER — Emergency Department (HOSPITAL_COMMUNITY): Payer: 59

## 2021-07-30 ENCOUNTER — Encounter (HOSPITAL_COMMUNITY): Payer: Self-pay

## 2021-07-30 ENCOUNTER — Other Ambulatory Visit: Payer: Self-pay

## 2021-07-30 DIAGNOSIS — E669 Obesity, unspecified: Secondary | ICD-10-CM | POA: Diagnosis present

## 2021-07-30 DIAGNOSIS — I824Y1 Acute embolism and thrombosis of unspecified deep veins of right proximal lower extremity: Secondary | ICD-10-CM | POA: Diagnosis not present

## 2021-07-30 DIAGNOSIS — A419 Sepsis, unspecified organism: Secondary | ICD-10-CM | POA: Diagnosis not present

## 2021-07-30 DIAGNOSIS — R000 Tachycardia, unspecified: Secondary | ICD-10-CM

## 2021-07-30 DIAGNOSIS — I82411 Acute embolism and thrombosis of right femoral vein: Secondary | ICD-10-CM | POA: Diagnosis present

## 2021-07-30 DIAGNOSIS — I82409 Acute embolism and thrombosis of unspecified deep veins of unspecified lower extremity: Secondary | ICD-10-CM

## 2021-07-30 DIAGNOSIS — D6832 Hemorrhagic disorder due to extrinsic circulating anticoagulants: Secondary | ICD-10-CM | POA: Diagnosis not present

## 2021-07-30 DIAGNOSIS — F419 Anxiety disorder, unspecified: Secondary | ICD-10-CM | POA: Diagnosis present

## 2021-07-30 DIAGNOSIS — R197 Diarrhea, unspecified: Secondary | ICD-10-CM | POA: Diagnosis not present

## 2021-07-30 DIAGNOSIS — Z79899 Other long term (current) drug therapy: Secondary | ICD-10-CM | POA: Diagnosis not present

## 2021-07-30 DIAGNOSIS — R600 Localized edema: Secondary | ICD-10-CM | POA: Diagnosis not present

## 2021-07-30 DIAGNOSIS — R0489 Hemorrhage from other sites in respiratory passages: Secondary | ICD-10-CM | POA: Diagnosis not present

## 2021-07-30 DIAGNOSIS — R0602 Shortness of breath: Secondary | ICD-10-CM | POA: Diagnosis not present

## 2021-07-30 DIAGNOSIS — Z86711 Personal history of pulmonary embolism: Secondary | ICD-10-CM | POA: Diagnosis not present

## 2021-07-30 DIAGNOSIS — J95821 Acute postprocedural respiratory failure: Secondary | ICD-10-CM | POA: Diagnosis not present

## 2021-07-30 DIAGNOSIS — R251 Tremor, unspecified: Secondary | ICD-10-CM | POA: Diagnosis present

## 2021-07-30 DIAGNOSIS — E7800 Pure hypercholesterolemia, unspecified: Secondary | ICD-10-CM | POA: Diagnosis present

## 2021-07-30 DIAGNOSIS — J90 Pleural effusion, not elsewhere classified: Secondary | ICD-10-CM | POA: Diagnosis not present

## 2021-07-30 DIAGNOSIS — J9601 Acute respiratory failure with hypoxia: Secondary | ICD-10-CM | POA: Diagnosis not present

## 2021-07-30 DIAGNOSIS — D62 Acute posthemorrhagic anemia: Secondary | ICD-10-CM | POA: Diagnosis not present

## 2021-07-30 DIAGNOSIS — R0603 Acute respiratory distress: Secondary | ICD-10-CM | POA: Diagnosis not present

## 2021-07-30 DIAGNOSIS — J984 Other disorders of lung: Secondary | ICD-10-CM | POA: Diagnosis not present

## 2021-07-30 DIAGNOSIS — J942 Hemothorax: Secondary | ICD-10-CM | POA: Diagnosis not present

## 2021-07-30 DIAGNOSIS — E876 Hypokalemia: Secondary | ICD-10-CM | POA: Diagnosis not present

## 2021-07-30 DIAGNOSIS — Z9049 Acquired absence of other specified parts of digestive tract: Secondary | ICD-10-CM

## 2021-07-30 DIAGNOSIS — Z86718 Personal history of other venous thrombosis and embolism: Secondary | ICD-10-CM | POA: Diagnosis not present

## 2021-07-30 DIAGNOSIS — J869 Pyothorax without fistula: Secondary | ICD-10-CM | POA: Diagnosis not present

## 2021-07-30 DIAGNOSIS — Z793 Long term (current) use of hormonal contraceptives: Secondary | ICD-10-CM

## 2021-07-30 DIAGNOSIS — F32A Depression, unspecified: Secondary | ICD-10-CM | POA: Diagnosis present

## 2021-07-30 DIAGNOSIS — E872 Acidosis, unspecified: Secondary | ICD-10-CM | POA: Diagnosis not present

## 2021-07-30 DIAGNOSIS — Z6836 Body mass index (BMI) 36.0-36.9, adult: Secondary | ICD-10-CM

## 2021-07-30 DIAGNOSIS — N920 Excessive and frequent menstruation with regular cycle: Secondary | ICD-10-CM | POA: Diagnosis present

## 2021-07-30 DIAGNOSIS — Z7901 Long term (current) use of anticoagulants: Secondary | ICD-10-CM

## 2021-07-30 DIAGNOSIS — I2602 Saddle embolus of pulmonary artery with acute cor pulmonale: Secondary | ICD-10-CM | POA: Diagnosis not present

## 2021-07-30 DIAGNOSIS — Z833 Family history of diabetes mellitus: Secondary | ICD-10-CM

## 2021-07-30 DIAGNOSIS — J189 Pneumonia, unspecified organism: Secondary | ICD-10-CM | POA: Diagnosis not present

## 2021-07-30 DIAGNOSIS — F17210 Nicotine dependence, cigarettes, uncomplicated: Secondary | ICD-10-CM | POA: Diagnosis present

## 2021-07-30 DIAGNOSIS — I2699 Other pulmonary embolism without acute cor pulmonale: Principal | ICD-10-CM

## 2021-07-30 DIAGNOSIS — R6520 Severe sepsis without septic shock: Secondary | ICD-10-CM | POA: Diagnosis not present

## 2021-07-30 DIAGNOSIS — R042 Hemoptysis: Secondary | ICD-10-CM | POA: Diagnosis not present

## 2021-07-30 DIAGNOSIS — Z720 Tobacco use: Secondary | ICD-10-CM

## 2021-07-30 DIAGNOSIS — R079 Chest pain, unspecified: Secondary | ICD-10-CM

## 2021-07-30 DIAGNOSIS — R071 Chest pain on breathing: Secondary | ICD-10-CM | POA: Diagnosis not present

## 2021-07-30 DIAGNOSIS — Z975 Presence of (intrauterine) contraceptive device: Secondary | ICD-10-CM

## 2021-07-30 DIAGNOSIS — T45515A Adverse effect of anticoagulants, initial encounter: Secondary | ICD-10-CM | POA: Diagnosis not present

## 2021-07-30 LAB — I-STAT BETA HCG BLOOD, ED (MC, WL, AP ONLY): I-stat hCG, quantitative: <5 m[IU]/mL (ref <5)

## 2021-07-30 LAB — CBC
HCT: 41.2 % (ref 36.0–46.0)
Hemoglobin: 14.6 g/dL (ref 12.0–15.0)
MCH: 32.5 pg (ref 26.0–34.0)
MCHC: 35.4 g/dL (ref 30.0–36.0)
MCV: 91.8 fL (ref 80.0–100.0)
Platelets: 281 10*3/uL (ref 150–400)
RBC: 4.49 MIL/uL (ref 3.87–5.11)
RDW: 12.2 % (ref 11.5–15.5)
WBC: 22.7 10*3/uL — ABNORMAL HIGH (ref 4.0–10.5)
nRBC: 0 % (ref 0.0–0.2)

## 2021-07-30 LAB — PROTIME-INR
INR: 1.4 — ABNORMAL HIGH (ref 0.8–1.2)
Prothrombin Time: 16.6 seconds — ABNORMAL HIGH (ref 11.4–15.2)

## 2021-07-30 LAB — BASIC METABOLIC PANEL
Anion gap: 13 (ref 5–15)
BUN: 8 mg/dL (ref 6–20)
CO2: 20 mmol/L — ABNORMAL LOW (ref 22–32)
Calcium: 9.6 mg/dL (ref 8.9–10.3)
Chloride: 103 mmol/L (ref 98–111)
Creatinine, Ser: 1.01 mg/dL — ABNORMAL HIGH (ref 0.44–1.00)
GFR, Estimated: >60 mL/min (ref >60)
Glucose, Bld: 125 mg/dL — ABNORMAL HIGH (ref 70–99)
Potassium: 3.9 mmol/L (ref 3.5–5.1)
Sodium: 136 mmol/L (ref 135–145)

## 2021-07-30 LAB — HIV ANTIBODY (ROUTINE TESTING W REFLEX): HIV Screen 4th Generation wRfx: NONREACTIVE

## 2021-07-30 LAB — HEPATIC FUNCTION PANEL
ALT: 12 U/L (ref 0–44)
AST: 11 U/L — ABNORMAL LOW (ref 15–41)
Albumin: 3.4 g/dL — ABNORMAL LOW (ref 3.5–5.0)
Alkaline Phosphatase: 53 U/L (ref 38–126)
Bilirubin, Direct: 0.2 mg/dL (ref 0.0–0.2)
Indirect Bilirubin: 1 mg/dL — ABNORMAL HIGH (ref 0.3–0.9)
Total Bilirubin: 1.2 mg/dL (ref 0.3–1.2)
Total Protein: 7.3 g/dL (ref 6.5–8.1)

## 2021-07-30 LAB — HEPARIN LEVEL (UNFRACTIONATED): Heparin Unfractionated: 0.85 IU/mL — ABNORMAL HIGH (ref 0.30–0.70)

## 2021-07-30 LAB — BRAIN NATRIURETIC PEPTIDE: B Natriuretic Peptide: 24.9 pg/mL (ref 0.0–100.0)

## 2021-07-30 LAB — TROPONIN I (HIGH SENSITIVITY)
Troponin I (High Sensitivity): 4 ng/L (ref <18)
Troponin I (High Sensitivity): 5 ng/L (ref <18)

## 2021-07-30 MED ORDER — IOHEXOL 350 MG/ML SOLN
70.0000 mL | Freq: Once | INTRAVENOUS | Status: AC | PRN
  Administered 2021-07-30: 70 mL via INTRAVENOUS

## 2021-07-30 MED ORDER — PROCHLORPERAZINE EDISYLATE 10 MG/2ML IJ SOLN
10.0000 mg | Freq: Four times a day (QID) | INTRAMUSCULAR | Status: DC | PRN
Start: 2021-07-30 — End: 2021-08-03
  Administered 2021-07-31: 10 mg via INTRAVENOUS
  Filled 2021-07-30: qty 2

## 2021-07-30 MED ORDER — ACETAMINOPHEN 325 MG PO TABS
650.0000 mg | ORAL_TABLET | Freq: Four times a day (QID) | ORAL | Status: DC | PRN
Start: 2021-07-30 — End: 2021-08-03
  Administered 2021-07-31 – 2021-08-02 (×2): 650 mg via ORAL
  Filled 2021-07-30 (×2): qty 2

## 2021-07-30 MED ORDER — HYDROMORPHONE HCL 1 MG/ML IJ SOLN
0.5000 mg | INTRAMUSCULAR | Status: DC | PRN
  Administered 2021-07-30 – 2021-08-03 (×10): 0.5 mg via INTRAVENOUS
  Filled 2021-07-30 (×3): qty 0.5
  Filled 2021-07-30 (×2): qty 1
  Filled 2021-07-30: qty 0.5
  Filled 2021-07-30: qty 1
  Filled 2021-07-30 (×2): qty 0.5
  Filled 2021-07-30: qty 1

## 2021-07-30 MED ORDER — LACTATED RINGERS IV SOLN
INTRAVENOUS | Status: AC
Start: 2021-07-30 — End: 2021-07-31

## 2021-07-30 MED ORDER — MELATONIN 5 MG PO TABS
5.0000 mg | ORAL_TABLET | Freq: Every evening | ORAL | Status: DC | PRN
Start: 2021-07-30 — End: 2021-08-03
  Administered 2021-07-30 – 2021-08-02 (×2): 5 mg via ORAL
  Filled 2021-07-30 (×2): qty 1

## 2021-07-30 MED ORDER — HEPARIN BOLUS VIA INFUSION
6000.0000 [IU] | Freq: Once | INTRAVENOUS | Status: DC
  Filled 2021-07-30: qty 6000

## 2021-07-30 MED ORDER — OXYCODONE HCL 5 MG PO TABS
5.0000 mg | ORAL_TABLET | Freq: Four times a day (QID) | ORAL | Status: DC | PRN
  Administered 2021-07-31 – 2021-08-02 (×8): 5 mg via ORAL
  Filled 2021-07-30 (×8): qty 1

## 2021-07-30 MED ORDER — HEPARIN BOLUS VIA INFUSION
5000.0000 [IU] | Freq: Once | INTRAVENOUS | Status: AC
Start: 2021-07-30 — End: 2021-07-30
  Administered 2021-07-30: 5000 [IU] via INTRAVENOUS
  Filled 2021-07-30: qty 5000

## 2021-07-30 MED ORDER — ROSUVASTATIN CALCIUM 20 MG PO TABS
20.0000 mg | ORAL_TABLET | Freq: Every day | ORAL | Status: DC
  Administered 2021-07-30 – 2021-08-02 (×4): 20 mg via ORAL
  Filled 2021-07-30 (×4): qty 1

## 2021-07-30 MED ORDER — POLYETHYLENE GLYCOL 3350 17 G PO PACK: 17.0000 g | PACK | Freq: Every day | ORAL | Status: DC | PRN

## 2021-07-30 MED ORDER — FENTANYL CITRATE PF 50 MCG/ML IJ SOSY
50.0000 ug | PREFILLED_SYRINGE | Freq: Once | INTRAMUSCULAR | Status: AC
  Administered 2021-07-30: 50 ug via INTRAVENOUS
  Filled 2021-07-30: qty 1

## 2021-07-30 MED ORDER — HEPARIN (PORCINE) 25000 UT/250ML-% IV SOLN
2450.0000 [IU]/h | INTRAVENOUS | Status: DC
  Administered 2021-07-30: 1700 [IU]/h via INTRAVENOUS
  Administered 2021-07-31: 2000 [IU]/h via INTRAVENOUS
  Administered 2021-07-31: 1950 [IU]/h via INTRAVENOUS
  Administered 2021-08-01 – 2021-08-02 (×3): 2350 [IU]/h via INTRAVENOUS
  Filled 2021-07-30 (×6): qty 250

## 2021-07-30 NOTE — ED Provider Notes (Signed)
[facility] EMERGENCY DEPARTMENT Provider Note   CSN: [id number] Arrival date & time: 07/30/21  1442     History  Chief Complaint  Patient presents with   Chest Pain    Tracy Garcia is a 36 y.o. female.  36 year old female with a past medical history of hyperlipidemia presents the ED following acute onset of shortness of breath and left-sided chest pain radiating into her left arm that began this morning.  Patient states that she was diagnosed with a DVT in her right leg yesterday and was started on Xarelto, but has only taken 1 dose.  She endorses associated nausea and vomiting but denies fevers.  She states that she has a remote history of a DVT that was diagnosed in pregnancy.  She has not required anticoagulation. She reports using a NuvaRing for contraception, denies OCP use.  The history is provided by the patient.       Home Medications Prior to Admission medications   Medication Sig Start Date End Date Taking? Authorizing Provider  NURTEC 75 MG TBDP Take 1 tablet by mouth daily as needed (pain). 04/29/21  Yes [provider]  NUVARING 0.12-0.015 MG/24HR vaginal ring Place 1 each vaginally every 28 (twenty-eight) days. 04/18/21  Yes [provider]  Rivaroxaban (XARELTO STARTER PACK PO) Take 15-20 mg by mouth as directed. Take 15 mg tablet BID for 21 Days. Then On Day 22 Switch to Take 20 mg tablet Daily   Yes [provider]  rosuvastatin (CRESTOR) 20 MG tablet Take 20 mg by mouth at bedtime. 07/20/21  Yes [provider]      Allergies    Shellfish allergy    Review of Systems   Review of Systems  Constitutional:  Negative for chills and fever.  Respiratory:  Positive for shortness of breath.   Cardiovascular:  Positive for chest pain.  Gastrointestinal:  Positive for nausea and vomiting. Negative for abdominal pain.    Physical Exam Updated Vital Signs BP 129/89   Pulse (!) 102   Temp 98.9 F (37.2 C) (Oral)    Resp 20   Ht 6' (1.829 m)   Wt 112.9 kg   LMP 07/30/2021 (Exact Date)   SpO2 96%   BMI 33.77 kg/m  Physical Exam Vitals and nursing note reviewed.  Constitutional:      General: She is not in acute distress.    Appearance: She is well-developed. She is not ill-appearing.  HENT:     Head: Normocephalic and atraumatic.  Cardiovascular:     Rate and Rhythm: Regular rhythm. Tachycardia present.     Pulses:          Radial pulses are 2+ on the right side and 2+ on the left side.     Heart sounds: No murmur heard.    Comments: Right > left lower extremity nonpitting edema Pulmonary:     Effort: Tachypnea present. No respiratory distress.     Breath sounds: Decreased air movement present.     Comments: Speaking in full sentences, decreased air movement secondary to poor inspiratory effort Abdominal:     Palpations: Abdomen is soft.     Tenderness: There is no abdominal tenderness.  Musculoskeletal:        General: No swelling.     Right lower leg: Edema present.     Left lower leg: Edema present.  Skin:    General: Skin is warm and dry.     Capillary Refill: Capillary refill takes less than  2 seconds.  Neurological:     Mental Status: She is alert.     ED Results / Procedures / Treatments   Labs (all labs ordered are listed, but only abnormal results are displayed) Labs Reviewed  BASIC METABOLIC PANEL - Abnormal; Notable for the following components:      Result Value   CO2 20 (*)    Glucose, Bld 125 (*)    Creatinine, Ser 1.01 (*)    All other components within normal limits  CBC - Abnormal; Notable for the following components:   WBC 22.7 (*)    All other components within normal limits  HEPARIN LEVEL (UNFRACTIONATED) - Abnormal; Notable for the following components:   Heparin Unfractionated 0.85 (*)    All other components within normal limits  PROTIME-INR - Abnormal; Notable for the following components:   Prothrombin Time 16.6 (*)    INR 1.4 (*)    All other  components within normal limits  HEPATIC FUNCTION PANEL  BRAIN NATRIURETIC PEPTIDE  HEPARIN LEVEL (UNFRACTIONATED)  CBC  APTT  HIV ANTIBODY (ROUTINE TESTING W REFLEX)  I-STAT BETA HCG BLOOD, ED ([facility], [facility], [facility] ONLY)  TROPONIN I (HIGH SENSITIVITY)  TROPONIN I (HIGH SENSITIVITY)    EKG None  Radiology CT Angio Chest PE W and/or Wo Contrast  Result Date: 07/30/2021 CLINICAL DATA:  Chest pain and shortness of breath. EXAM: CT ANGIOGRAPHY CHEST WITH CONTRAST TECHNIQUE: Multidetector CT imaging of the chest was performed using the standard protocol during bolus administration of intravenous contrast. Multiplanar CT image reconstructions and MIPs were obtained to evaluate the vascular anatomy. RADIATION DOSE REDUCTION: This exam was performed according to the departmental dose-optimization program which includes automated exposure control, adjustment of the mA and/or kV according to patient size and/or use of iterative reconstruction technique. CONTRAST:  62mL OMNIPAQUE IOHEXOL 350 MG/ML SOLN COMPARISON:  Chest pain and shortness of breath. Patient has a known DVT. FINDINGS: Cardiovascular: The heart is normal in size. No pericardial effusion. No evidence of right heart strain. The RV LV ratio is normal. The aorta is normal in caliber. No dissection. The branch vessels are patent. The pulmonary arterial tree is well opacified. There is a large and fairly extensive pulmonary embolism on the left side involving the left lower lobe pulmonary artery the proximal artery and its branches are completely occluded. I do not see any findings for pulmonary embolism on the right side. Mediastinum/Nodes: No mediastinal or hilar mass or lymphadenopathy. The esophagus is grossly normal. The thyroid gland is unremarkable. Lungs/Pleura: Pronounced left lower lobe atelectasis likely associated with the pulmonary embolism. No pulmonary edema or pleural effusion. No pulmonary lesions. Upper Abdomen: No significant upper  abdominal findings. Musculoskeletal: No breast masses, supraclavicular or axillary adenopathy. The bony thorax is intact. Review of the MIP images confirms the above findings. IMPRESSION: 1. Fairly extensive left-sided pulmonary embolism involving the left lower lobe pulmonary artery and its branches. No findings for right heart strain. No right-sided pulmonary emboli. 2. Pronounced left lower lobe atelectasis likely associated with the pulmonary embolism. 3. Normal thoracic aorta. Electronically Signed   By: Rudie Meyer M.D.   On: 07/30/2021 18:30   US Venous Img Lower Unilateral Right (DVT)  Result Date: 07/29/2021 CLINICAL DATA:  rt leg pain EXAM: RIGHT LOWER EXTREMITY VENOUS DOPPLER ULTRASOUND TECHNIQUE: Gray-scale sonography with graded compression, as well as color Doppler and duplex ultrasound were performed to evaluate the lower extremity deep venous systems from the level of the common femoral vein and including  the common femoral, femoral, profunda femoral, popliteal and calf veins including the posterior tibial, peroneal and gastrocnemius veins when visible. The superficial great saphenous vein was also interrogated. Spectral Doppler was utilized to evaluate flow at rest and with distal augmentation maneuvers in the common femoral, femoral and popliteal veins. COMPARISON:  None Available. FINDINGS: Contralateral Common Femoral Vein: Respiratory phasicity is normal and symmetric with the symptomatic side. No evidence of thrombus. Normal compressibility. Common Femoral Vein: Thrombus from the saphenofemoral junction extends 5 mm into the common femoral vein. Otherwise, patent. Saphenofemoral Junction: Thrombus extends into the saphofemoral junction by approximately 5 mm. Profunda Femoral Vein: No evidence of thrombus. Normal compressibility and flow on color Doppler imaging. Femoral Vein: No evidence of thrombus. Normal compressibility, respiratory phasicity and response to augmentation. Popliteal  Vein: No evidence of thrombus. Normal compressibility, respiratory phasicity and response to augmentation. Calf Veins: No evidence of thrombus. Normal compressibility and flow on color Doppler imaging. Superficial Great Saphenous Vein: Positive for occlusive thrombus. Findings will be called to the provider by the performing technologist Abby (as documented in [facility] PACS). IMPRESSION: Positive for DVT involving the right saphofemoral junction extending into the common femoral vein with occlusive thrombus of the superficial great saphenous vein. Electronically Signed   By: Feliberto Harts M.D.   On: 07/29/2021 14:00    Procedures Procedures    Medications Ordered in ED Medications  heparin ADULT infusion 100 units/mL (25000 units/251mL) (1,700 Units/hr Intravenous New Bag/Given 07/30/21 1941)  acetaminophen (TYLENOL) tablet 650 mg (has no administration in time range)  oxyCODONE (Oxy IR/ROXICODONE) immediate release tablet 5 mg (has no administration in time range)  HYDROmorphone (DILAUDID) injection 0.5 mg (has no administration in time range)  polyethylene glycol (MIRALAX / GLYCOLAX) packet 17 g (has no administration in time range)  prochlorperazine (COMPAZINE) injection 10 mg (has no administration in time range)  melatonin tablet 5 mg (has no administration in time range)  lactated ringers infusion (has no administration in time range)  iohexol (OMNIPAQUE) 350 MG/ML injection 70 mL (70 mLs Intravenous Contrast Given 07/30/21 1824)  fentaNYL (SUBLIMAZE) injection 50 mcg (50 mcg Intravenous Given 07/30/21 1936)  heparin bolus via infusion 5,000 Units (5,000 Units Intravenous Bolus from Bag 07/30/21 1942)    ED Course/ Medical Decision Making/ A&P                           Medical Decision Making Amount and/or Complexity of Data Reviewed External Data Reviewed: notes. Labs: ordered. Radiology: ordered and independent interpretation performed. ECG/medicine tests:  ordered.  Risk Prescription drug management. Drug therapy requiring intensive monitoring for toxicity. Decision regarding hospitalization.   36 year old female with a history as above who presents following an acute onset of shortness of breath and left-sided chest pain.  On arrival, patient is mildly tachypneic and tachycardic but satting appropriately on room air.  EKG as above.  CTA PE study was obtained in triage and concerning for a large left-sided pulmonary embolism involving the left lobar pulmonary artery.  No radiographic evidence of right heart strain.   I also reviewed and interpreted the patient's labs which were notable for leukocytosis of 22.7, likely reactive in the setting of her PE.  BMP without significant electrolyte abnormalities, no evidence of renal dysfunction.  Normal anion gap.  Troponin stable at 4 -> 5. Pregnancy negative.  Patient was started on a heparin drip while in the ED.  I spoke with the inpatient hospitalist who is  agreeable to accept the patient to their service for further management of her unprovoked PE.  Question if this could be related to her estrogen use. These results and plan of care were discussed with the patient and her fianc at bedside who verbalized understanding.  At this time, patient is stable for transfer to the floor.  Final Clinical Impression(s) / ED Diagnoses Final diagnoses:  Acute pulmonary embolism, unspecified pulmonary embolism type, unspecified whether acute cor pulmonale present [facility])    Rx / DC Orders ED Discharge Orders     None         Tyvion Edmondson, [place], MD 07/30/21 2041    Jacalyn Lefevre, MD 07/30/21 2057

## 2021-07-30 NOTE — ED Notes (Signed)
Pt given water, ok by MD 

## 2021-07-30 NOTE — Progress Notes (Signed)
Pharmacy Consult for heparin gtt  Indication: pulmonary embolus  Allergies  Allergen Reactions   Shellfish Allergy Hives    Itching     Patient Measurements: Height: 6' (182.9 cm) Weight: 112.9 kg (249 lb) IBW/kg (Calculated) : 73.1 HEPARIN DW (KG): 97.8  Vital Signs: Temp: 98.9 F (37.2 C) (06/15 1459) Temp Source: Oral (06/15 1459) BP: 129/89 (06/15 1832) Pulse Rate: 102 (06/15 1832)  Labs: Recent Labs    07/30/21 1510  HGB 14.6  HCT 41.2  PLT 281  CREATININE 1.01*    Estimated Creatinine Clearance: 110.3 mL/min (A) (by C-G formula based on SCr of 1.01 mg/dL (H)).  Assessment: Tracy Garcia a 36 y.o. female presented with PE/DVT of right leg. Pharmacy has been consulted for heparin dosing.   Anticoagulation PTA: Patient noted to have been prescribed Xarelto for DVT 6/14 but patient has only taken one dose.   Goal of Therapy:  Heparin level 0.3-0.7 units/ml Monitor platelets by anticoagulation protocol: Yes   Plan:  Heparin bolus via infusion 5,000 Units x1 Start heparin infusion at 1,700 units/hour Check baseline heparin (anti-Xa level given Xarelto dose) Check heparin level/aPTT in 6 hours and daily while on heparin, daily CBC  Monitor for signs and symptoms of bleeding  F/U anticoag plan   Jani Gravel, PharmD PGY-1 Acute Care Resident  07/30/2021 7:22 PM

## 2021-07-30 NOTE — ED Triage Notes (Addendum)
Pt reports yesterday she was diagnosed with a DVT in her right leg and now reports left sided chest pain and SOB that started this morning as well as left arm numbness onset at 12pm today. Was prescribed Xarelto yesterday and has only taken one dose thus far.

## 2021-07-30 NOTE — H&P (Addendum)
History and Physical  Tracy Garcia OIZ:[id number] DOB: May 03, 1985 DOA: 07/30/2021  Referring physician:  Dr. Axel Filler, resident-EDP PCP: Fayrene Helper, NP  Outpatient Specialists: None. Patient coming from: Home.  Chief Complaint: Left-sided chest pain and shortness of breath.  HPI: Tracy Garcia is a 36 y.o. female with medical history significant for obesity, hyperlipidemia, tobacco abuse 1 ppd since the age of 2, who presents to [facility] ED with sudden onset shortness of breath.  Associated with left-sided chest pain, worse with taking a deep breath.  Endorses having a non productive coughing fit last night which caused her to vomit once.  No subjective fevers or chills.  Admits to a sedentary lifestyle since she started taking are of her mother weeks ago.  The patient sits for hours.  No recent trips.  No trauma.  No family history of blood clots.    Recently diagnosed with right lower extremity DVT by PCP after having 2 weeks of right lower extremity swelling and pain.  She was started on Xarelto yesterday.  Work-up in the ED reveals extensive left-sided pulmonary embolism with associated extensive left lower lobe atelectasis.  No CT evidence of right heart strain.  Tachycardic and tachypneic.  Not hypoxic.  The patient was started on heparin drip in the ED.  The hospitalist service, [facility], was asked to admit.  ED Course: Tmax 98.9.  BP 129/89 was 102, respiration rate 20, O2 saturation 96%.  Lab studies remarkable for serum bicarb 20, glucose 125, creatinine 1.01 with GFR greater than 60.  Troponin 5.  WBC 22.7K.  Hemoglobin 14.6.  Review of Systems: Review of systems as noted in the HPI. All other systems reviewed and are negative.   Past Medical History:  Diagnosis Date   Anxiety    Depression    DVT, lower extremity (HCC) 07/29/2021   RIGHT   High cholesterol    Migraine    Tremor of both hands    Past Surgical History:  Procedure Laterality Date   CHOLECYSTECTOMY     TONSILLECTOMY      TUBAL LIGATION      Social History:  reports that she has been smoking cigarettes. She has been smoking an average of 1 pack per day. She has never used smokeless tobacco. She reports current alcohol use. She reports current drug use. Drug: Marijuana.   Allergies  Allergen Reactions   Shellfish Allergy Hives    Itching     Family History  Problem Relation Age of Onset   Diabetes Mother    Diabetes Father       Prior to Admission medications   Medication Sig Start Date End Date Taking? Authorizing Provider  Cholecalciferol (VITAMIN D3 PO) Take by mouth.    [provider]  Multiple Vitamin (MULTIVITAMIN PO) Take by mouth.    [provider]  NURTEC 75 MG TBDP Take by mouth. 04/29/21   [provider]  NUVARING 0.12-0.015 MG/24HR vaginal ring Place vaginally. 04/18/21   [provider]    Physical Exam: BP 129/89   Pulse (!) 102   Temp 98.9 F (37.2 C) (Oral)   Resp 20   Ht 6' (1.829 m)   Wt 112.9 kg   LMP 07/30/2021 (Exact Date)   SpO2 96%   BMI 33.77 kg/m   General: 36 y.o. year-old female well developed well nourished in no acute distress.  Alert and oriented x3.  Uncomfortable due to pleuritic pain with conversational dyspnea. Cardiovascular: Tachycardic with no rubs or gallops.  No thyromegaly or JVD noted.  Trace lower extremity edema. 2/4 pulses in all 4 extremities. Respiratory: Faint rales at bases.  No wheezing noted.  Poor inspiratory effort. Abdomen: Soft nontender nondistended with normal bowel sounds x4 quadrants. Muskuloskeletal: No cyanosis or clubbing.  Trace lower extremity edema noted bilaterally. Neuro: CN II-XII intact, strength, sensation, reflexes Skin: No ulcerative lesions noted or rashes Psychiatry: Judgement and insight appear normal. Mood is appropriate for condition and setting          Labs on Admission:  Basic Metabolic Panel: Recent Labs  Lab 07/30/21 1510  NA 136  K 3.9  CL 103  CO2 20*   GLUCOSE 125*  BUN 8  CREATININE 1.01*  CALCIUM 9.6   Liver Function Tests: No results for input(s): "AST", "ALT", "ALKPHOS", "BILITOT", "PROT", "ALBUMIN" in the last 168 hours. No results for input(s): "LIPASE", "AMYLASE" in the last 168 hours. No results for input(s): "AMMONIA" in the last 168 hours. CBC: Recent Labs  Lab 07/30/21 1510  WBC 22.7*  HGB 14.6  HCT 41.2  MCV 91.8  PLT 281   Cardiac Enzymes: No results for input(s): "CKTOTAL", "CKMB", "CKMBINDEX", "TROPONINI" in the last 168 hours.  BNP (last 3 results) No results for input(s): "BNP" in the last 8760 hours.  ProBNP (last 3 results) No results for input(s): "PROBNP" in the last 8760 hours.  CBG: No results for input(s): "GLUCAP" in the last 168 hours.  Radiological Exams on Admission: CT Angio Chest PE W and/or Wo Contrast  Result Date: 07/30/2021 CLINICAL DATA:  Chest pain and shortness of breath. EXAM: CT ANGIOGRAPHY CHEST WITH CONTRAST TECHNIQUE: Multidetector CT imaging of the chest was performed using the standard protocol during bolus administration of intravenous contrast. Multiplanar CT image reconstructions and MIPs were obtained to evaluate the vascular anatomy. RADIATION DOSE REDUCTION: This exam was performed according to the departmental dose-optimization program which includes automated exposure control, adjustment of the mA and/or kV according to patient size and/or use of iterative reconstruction technique. CONTRAST:  82mL OMNIPAQUE IOHEXOL 350 MG/ML SOLN COMPARISON:  Chest pain and shortness of breath. Patient has a known DVT. FINDINGS: Cardiovascular: The heart is normal in size. No pericardial effusion. No evidence of right heart strain. The RV LV ratio is normal. The aorta is normal in caliber. No dissection. The branch vessels are patent. The pulmonary arterial tree is well opacified. There is a large and fairly extensive pulmonary embolism on the left side involving the left lower lobe pulmonary  artery the proximal artery and its branches are completely occluded. I do not see any findings for pulmonary embolism on the right side. Mediastinum/Nodes: No mediastinal or hilar mass or lymphadenopathy. The esophagus is grossly normal. The thyroid gland is unremarkable. Lungs/Pleura: Pronounced left lower lobe atelectasis likely associated with the pulmonary embolism. No pulmonary edema or pleural effusion. No pulmonary lesions. Upper Abdomen: No significant upper abdominal findings. Musculoskeletal: No breast masses, supraclavicular or axillary adenopathy. The bony thorax is intact. Review of the MIP images confirms the above findings. IMPRESSION: 1. Fairly extensive left-sided pulmonary embolism involving the left lower lobe pulmonary artery and its branches. No findings for right heart strain. No right-sided pulmonary emboli. 2. Pronounced left lower lobe atelectasis likely associated with the pulmonary embolism. 3. Normal thoracic aorta. Electronically Signed   By: Rudie Meyer M.D.   On: 07/30/2021 18:30   US Venous Img Lower Unilateral Right (DVT)  Result Date: 07/29/2021 CLINICAL DATA:  rt leg pain EXAM: RIGHT LOWER EXTREMITY  VENOUS DOPPLER ULTRASOUND TECHNIQUE: Gray-scale sonography with graded compression, as well as color Doppler and duplex ultrasound were performed to evaluate the lower extremity deep venous systems from the level of the common femoral vein and including the common femoral, femoral, profunda femoral, popliteal and calf veins including the posterior tibial, peroneal and gastrocnemius veins when visible. The superficial great saphenous vein was also interrogated. Spectral Doppler was utilized to evaluate flow at rest and with distal augmentation maneuvers in the common femoral, femoral and popliteal veins. COMPARISON:  None Available. FINDINGS: Contralateral Common Femoral Vein: Respiratory phasicity is normal and symmetric with the symptomatic side. No evidence of thrombus. Normal  compressibility. Common Femoral Vein: Thrombus from the saphenofemoral junction extends 5 mm into the common femoral vein. Otherwise, patent. Saphenofemoral Junction: Thrombus extends into the saphofemoral junction by approximately 5 mm. Profunda Femoral Vein: No evidence of thrombus. Normal compressibility and flow on color Doppler imaging. Femoral Vein: No evidence of thrombus. Normal compressibility, respiratory phasicity and response to augmentation. Popliteal Vein: No evidence of thrombus. Normal compressibility, respiratory phasicity and response to augmentation. Calf Veins: No evidence of thrombus. Normal compressibility and flow on color Doppler imaging. Superficial Great Saphenous Vein: Positive for occlusive thrombus. Findings will be called to the provider by the performing technologist Abby (as documented in [facility] PACS). IMPRESSION: Positive for DVT involving the right saphofemoral junction extending into the common femoral vein with occlusive thrombus of the superficial great saphenous vein. Electronically Signed   By: Feliberto Harts M.D.   On: 07/29/2021 14:00    EKG: I independently viewed the EKG done and my findings are as followed: Sinus tachycardia rate of 127, nonspecific ST-T changes.  QTc 412.  Assessment/Plan Present on Admission:  Acute pulmonary embolism (HCC)  Principal Problem:   Acute pulmonary embolism (HCC)  Extensive left-sided pulmonary embolism, POA, seen on CT scan With associated left lower lobe atelectasis, per radiology report, likely associated with a pulmonary embolism. High-sensitivity troponin negative x2 BNP is pending Started on heparin drip in the ED, continue for 48 hours. Ordered 2D echo Incentive spirometer Mobilize as tolerated Analgesics as needed Not hypoxic  Leukocytosis, suspect reactive in the setting of acute pulmonary embolism Afebrile Monitor WBC and fever curve Obtain CBC with differentials in the morning Obtain baseline  procalcitonin  Non anion gap metabolic acidosis Serum bicarb 20, anion gap 13 Gentle IV fluid hydration LR at 50 cc/h x 1 day  Right lower extremity DVT/edema Diagnosed by PCP and started on Xarelto on 07/29/2021 Elevate extremity Analgesics as needed TOC to assist with medication and insurance coverage  Obesity BMI 33 Recommend weight loss outpatient with regular physical activity and healthy dieting   Hyperlipidemia Resume home Crestor 20 mg nightly  Menorrhagia on birth control She uses NuvaRing States when she does not use the NuvaRing she bleeds twice a month Consider curbside with gynecology regarding options in the setting of DVT and pulmonary embolism.  Tobacco use disorder Tobacco cessation counseling done at bedside Smokes since the age of 6, her mother and fianc also smoke    Critical care time: 65 minutes.     DVT prophylaxis: Heparin drip  Code Status: Full code  Family Communication: Fianc at bedside  Disposition Plan: Admitted to telemetry medical unit  Consults called: None.  Admission status: Inpatient status.   Status is: Inpatient The patient will require at least 2 midnights for further evaluation and treatment of present condition.   Darlin Drop MD [facility] Pager [phone]  If 7PM-7AM, please contact night-coverage [email or web address] Password [facility]  07/30/2021, 7:32 PM

## 2021-07-30 NOTE — ED Provider Triage Note (Signed)
Emergency Medicine Provider Triage Evaluation Note  Tracy Garcia , a 36 y.o. female  was evaluated in triage.  Pt complains of shortness of breath  Pt has a known dvt   Review of Systems  Positive:  Negative:   Physical Exam  BP (!) 158/130 (BP Location: Right Arm)   Pulse (!) 126   Temp 98.9 F (37.2 C) (Oral)   Resp (!) 36   Ht 6' (1.829 m)   Wt 112.9 kg   LMP 07/30/2021 (Exact Date)   SpO2 97%   BMI 33.77 kg/m  Gen:   Awake, no distress   Resp:  Tachypnea and tachycardia MSK:   Moves extremities without difficulty  Other:    Medical Decision Making  Medically screening exam initiated at 3:24 PM.  Appropriate orders placed.  Tracy Garcia was informed that the remainder of the evaluation will be completed by another provider, this initial triage assessment does not replace that evaluation, and the importance of remaining in the ED until their evaluation is complete.     Tracy Garcia, [place] 07/30/21 1525

## 2021-07-31 ENCOUNTER — Inpatient Hospital Stay (HOSPITAL_COMMUNITY): Payer: 59

## 2021-07-31 ENCOUNTER — Other Ambulatory Visit (HOSPITAL_COMMUNITY): Payer: Self-pay

## 2021-07-31 DIAGNOSIS — I2602 Saddle embolus of pulmonary artery with acute cor pulmonale: Secondary | ICD-10-CM

## 2021-07-31 DIAGNOSIS — I2699 Other pulmonary embolism without acute cor pulmonale: Secondary | ICD-10-CM | POA: Diagnosis not present

## 2021-07-31 LAB — CBC WITH DIFFERENTIAL/PLATELET
Abs Immature Granulocytes: 0.13 10*3/uL — ABNORMAL HIGH (ref 0.00–0.07)
Basophils Absolute: 0.1 10*3/uL (ref 0.0–0.1)
Basophils Relative: 0 %
Eosinophils Absolute: 0 10*3/uL (ref 0.0–0.5)
Eosinophils Relative: 0 %
HCT: 35.6 % — ABNORMAL LOW (ref 36.0–46.0)
Hemoglobin: 12.9 g/dL (ref 12.0–15.0)
Immature Granulocytes: 1 %
Lymphocytes Relative: 9 %
Lymphs Abs: 2.2 10*3/uL (ref 0.7–4.0)
MCH: 32.3 pg (ref 26.0–34.0)
MCHC: 36.2 g/dL — ABNORMAL HIGH (ref 30.0–36.0)
MCV: 89 fL (ref 80.0–100.0)
Monocytes Absolute: 1.4 10*3/uL — ABNORMAL HIGH (ref 0.1–1.0)
Monocytes Relative: 6 %
Neutro Abs: 19.8 10*3/uL — ABNORMAL HIGH (ref 1.7–7.7)
Neutrophils Relative %: 84 %
Platelets: 254 10*3/uL (ref 150–400)
RBC: 4 MIL/uL (ref 3.87–5.11)
RDW: 12.2 % (ref 11.5–15.5)
WBC: 23.6 10*3/uL — ABNORMAL HIGH (ref 4.0–10.5)
nRBC: 0 % (ref 0.0–0.2)

## 2021-07-31 LAB — HEPARIN LEVEL (UNFRACTIONATED): Heparin Unfractionated: 0.64 IU/mL (ref 0.30–0.70)

## 2021-07-31 LAB — ECHOCARDIOGRAM COMPLETE
Area-P 1/2: 5.88 cm2
Calc EF: 54.5 %
Height: 72 in
S' Lateral: 4.2 cm
Single Plane A2C EF: 43.2 %
Single Plane A4C EF: 67.9 %
Weight: 3984 oz

## 2021-07-31 LAB — COMPREHENSIVE METABOLIC PANEL
ALT: 12 U/L (ref 0–44)
AST: 12 U/L — ABNORMAL LOW (ref 15–41)
Albumin: 3.1 g/dL — ABNORMAL LOW (ref 3.5–5.0)
Alkaline Phosphatase: 57 U/L (ref 38–126)
Anion gap: 13 (ref 5–15)
BUN: 10 mg/dL (ref 6–20)
CO2: 20 mmol/L — ABNORMAL LOW (ref 22–32)
Calcium: 8.8 mg/dL — ABNORMAL LOW (ref 8.9–10.3)
Chloride: 99 mmol/L (ref 98–111)
Creatinine, Ser: 0.97 mg/dL (ref 0.44–1.00)
GFR, Estimated: >60 mL/min (ref >60)
Glucose, Bld: 116 mg/dL — ABNORMAL HIGH (ref 70–99)
Potassium: 3.9 mmol/L (ref 3.5–5.1)
Sodium: 132 mmol/L — ABNORMAL LOW (ref 135–145)
Total Bilirubin: 1 mg/dL (ref 0.3–1.2)
Total Protein: 6.9 g/dL (ref 6.5–8.1)

## 2021-07-31 LAB — APTT
aPTT: 55 seconds — ABNORMAL HIGH (ref 24–36)
aPTT: 66 seconds — ABNORMAL HIGH (ref 24–36)
aPTT: 58 seconds — ABNORMAL HIGH (ref 24–36)

## 2021-07-31 LAB — MAGNESIUM: Magnesium: 1.4 mg/dL — ABNORMAL LOW (ref 1.7–2.4)

## 2021-07-31 LAB — PHOSPHORUS: Phosphorus: 3.3 mg/dL (ref 2.5–4.6)

## 2021-07-31 LAB — PROCALCITONIN: Procalcitonin: 0.31 ng/mL

## 2021-07-31 MED ORDER — TRAMADOL HCL 50 MG PO TABS
50.0000 mg | ORAL_TABLET | Freq: Once | ORAL | Status: AC
  Administered 2021-07-31: 50 mg via ORAL
  Filled 2021-07-31: qty 1

## 2021-07-31 MED ORDER — MAGNESIUM SULFATE 2 GM/50ML IV SOLN
2.0000 g | Freq: Once | INTRAVENOUS | Status: AC
  Administered 2021-07-31: 2 g via INTRAVENOUS
  Filled 2021-07-31: qty 50

## 2021-07-31 MED ORDER — HEPARIN BOLUS VIA INFUSION
2000.0000 [IU] | Freq: Once | INTRAVENOUS | Status: AC
  Administered 2021-07-31: 2000 [IU] via INTRAVENOUS
  Filled 2021-07-31: qty 2000

## 2021-07-31 MED ORDER — PERFLUTREN LIPID MICROSPHERE
1.0000 mL | INTRAVENOUS | Status: AC | PRN
  Administered 2021-07-31: 2 mL via INTRAVENOUS

## 2021-07-31 NOTE — Progress Notes (Signed)
ANTICOAGULATION CONSULT NOTE  Pharmacy Consult for Heparin Indication: pulmonary embolus Brief A/P: aPTT subtherapeutic Increase Heparin rate  Allergies  Allergen Reactions   Shellfish Allergy Hives    Itching     Patient Measurements: Height: 6' (182.9 cm) Weight: 112.9 kg (249 lb) IBW/kg (Calculated) : 73.1 Heparin Dosing Weight: 100 kg  Vital Signs: BP: 130/89 (06/16 0300) Pulse Rate: 103 (06/16 0300)  Labs: Recent Labs    07/30/21 1510 07/30/21 1749 07/30/21 1930 07/31/21 0345  HGB 14.6  --   --  12.9  HCT 41.2  --   --  35.6*  PLT 281  --   --  254  APTT  --   --   --  55*  LABPROT  --   --  16.6*  --   INR  --   --  1.4*  --   HEPARINUNFRC  --   --  0.85* 0.64  CREATININE 1.01*  --   --  0.97  TROPONINIHS 4 5  --   --     Estimated Creatinine Clearance: 114.8 mL/min (by C-G formula based on SCr of 0.97 mg/dL).   Assessment: 36 y.o. female with PE/DVT for heparin Goal of Therapy:  aPTT 66-102 sec Heparin level 0.3-0.7 units/mL Monitor platelets by anticoagulation protocol: Yes   Plan:  Heparin 2000 units IV bolus, then increase heparin 1950 units/hr aPTT in 6 hours    Tracy Garcia, [place] Fleet 07/31/2021,4:58 AM

## 2021-07-31 NOTE — Progress Notes (Signed)
  Echocardiogram 2D Echocardiogram has been performed.  Tracy Garcia 07/31/2021, 11:29 AM

## 2021-07-31 NOTE — Progress Notes (Addendum)
ANTICOAGULATION CONSULT NOTE - Follow Up Consult  Pharmacy Consult for Heparin infusion  Indication: pulmonary embolus  Allergies  Allergen Reactions   Shellfish Allergy Hives    Itching     Patient Measurements: Height: 6' (182.9 cm) Weight: 115.8 kg (255 lb 4.7 oz) IBW/kg (Calculated) : 73.1 Heparin Dosing Weight: 100 kg  Vital Signs: Temp: 100.3 F (37.9 C) (06/16 1900) Temp Source: Oral (06/16 1900) BP: 141/93 (06/16 1900) Pulse Rate: 133 (06/16 1900)  Labs: Recent Labs    07/30/21 1510 07/30/21 1749 07/30/21 1930 07/31/21 0345 07/31/21 1259 07/31/21 2033  HGB 14.6  --   --  12.9  --   --   HCT 41.2  --   --  35.6*  --   --   PLT 281  --   --  254  --   --   APTT  --   --   --  55* 66* 58*  LABPROT  --   --  16.6*  --   --   --   INR  --   --  1.4*  --   --   --   HEPARINUNFRC  --   --  0.85* 0.64  --   --   CREATININE 1.01*  --   --  0.97  --   --   TROPONINIHS 4 5  --   --   --   --      Estimated Creatinine Clearance: 116.4 mL/min (by C-G formula based on SCr of 0.97 mg/dL).   Medications:  Medications Prior to Admission  Medication Sig Dispense Refill Last Dose   NURTEC 75 MG TBDP Take 1 tablet by mouth daily as needed (pain).   Past Week   NUVARING 0.12-0.015 MG/24HR vaginal ring Place 1 each vaginally every 28 (twenty-eight) days.   07/30/2021 at ongoing   Rivaroxaban (XARELTO STARTER PACK PO) Take 15-20 mg by mouth as directed. Take 15 mg tablet BID for 21 Days. Then On Day 22 Switch to Take 20 mg tablet Daily   07/29/2021 at 1600   rosuvastatin (CRESTOR) 20 MG tablet Take 20 mg by mouth at bedtime.   07/29/2021    Assessment: 36 yo F presented to ED with SOB, L-sided chest pain worsening when taking deep breaths and nonproductive cough. Diagnosed with DVT recently by PCP and initiated on Xarelto. CT PE in ED positive for L-sided PE. No evidence of right heart strain. Pharmacy consulted for heparin infusion for PE/DVT.  Last dose of Xarelto was 6/14  @ 1600.   PM update: aPTT of 58 is subtherapeutic on heparin 2000 units/hr. aPTT decrease with heparin rate increase. Per RN no issues with IV access or infusion. No bleeding noted per RN.   Goal of Therapy:  aPTT 66-102 Heparin level 0.3-0.7 units/ml Monitor platelets by anticoagulation protocol: Yes   Plan:  Increase heparin to 2150 units/hr  Check 6 hr aPTT and heparin level  Monitor aPTT every 6 hours until therapeutic x2 or correlating with heparin level Daily CBC and heparin levels  Monitor for s/sx of bleeding F/u on plans to transition to oral anticoag  Gerrit Halls, PharmD, BCPS Clinical Pharmacist 07/31/2021 9:18 PM

## 2021-07-31 NOTE — Progress Notes (Addendum)
ANTICOAGULATION CONSULT NOTE - Follow Up Consult  Pharmacy Consult for Heparin infusion  Indication: pulmonary embolus  Allergies  Allergen Reactions   Shellfish Allergy Hives    Itching     Patient Measurements: Height: 6' (182.9 cm) Weight: 115.8 kg (255 lb 4.7 oz) IBW/kg (Calculated) : 73.1 Heparin Dosing Weight: 100 kg  Vital Signs: Temp: 98.1 Garcia (36.7 C) (06/16 1351) Temp Source: Oral (06/16 1138) BP: 145/92 (06/16 1351) Pulse Rate: 99 (06/16 1351)  Labs: Recent Labs    07/30/21 1510 07/30/21 1749 07/30/21 1930 07/31/21 0345 07/31/21 1259  HGB 14.6  --   --  12.9  --   HCT 41.2  --   --  35.6*  --   PLT 281  --   --  254  --   APTT  --   --   --  55* 66*  LABPROT  --   --  16.6*  --   --   INR  --   --  1.4*  --   --   HEPARINUNFRC  --   --  0.85* 0.64  --   CREATININE 1.01*  --   --  0.97  --   TROPONINIHS 4 5  --   --   --     Estimated Creatinine Clearance: 116.4 mL/min (by C-G formula based on SCr of 0.97 mg/dL).   Medications:  Medications Prior to Admission  Medication Sig Dispense Refill Last Dose   NURTEC 75 MG TBDP Take 1 tablet by mouth daily as needed (pain).   Past Week   NUVARING 0.12-0.015 MG/24HR vaginal ring Place 1 each vaginally every 28 (twenty-eight) days.   07/30/2021 at ongoing   Rivaroxaban (XARELTO STARTER PACK PO) Take 15-20 mg by mouth as directed. Take 15 mg tablet BID for 21 Days. Then On Day 22 Switch to Take 20 mg tablet Daily   07/29/2021 at 1600   rosuvastatin (CRESTOR) 20 MG tablet Take 20 mg by mouth at bedtime.   07/29/2021    Assessment: 36 yo Garcia presented to ED with SOB, L-sided chest pain worsening when taking deep breaths and nonproductive cough. Diagnosed with DVT recently by PCP and initiated on Xarelto. CT PE in ED positive for L-sided PE. No evidence of right heart strain. Pharmacy consulted for heparin infusion for PE/DVT.  Last dose of Xarelto was 6/14 @ 1600.   aPTT = 66, therapeutic Current heparin infusion  rate = 1950 units/hr No s/sx of bleeding  Goal of Therapy:  aPTT 66-102 Heparin level 0.3-0.7 units/ml Monitor platelets by anticoagulation protocol: Yes   Plan:  Increase heparin rate slightly to 2000 units/hr since aPTT is at the low end of the therapeutic range.  Monitor aPTT every 6 hours until therapeutic x2 or correlating with heparin level Daily CBC and heparin levels  Monitor for s/sx of bleeding Garcia/u on plans to transition to oral anticoag  Doristine Counter 07/31/2021,2:16 PM

## 2021-07-31 NOTE — ED Notes (Signed)
ECHO at bedside.

## 2021-07-31 NOTE — TOC Benefit Eligibility Note (Signed)
Patient Product/process development scientist completed.    The patient is currently admitted and upon discharge could be taking Eliquis 5 mg.  The current 30 day co-pay is, $15.00.   The patient is insured through [employer]     Roland Earl, CPhT Pharmacy Patient Advocate Specialist [facility] Pharmacy Patient Advocate Team Direct Number: [phone]  Fax: [phone]

## 2021-07-31 NOTE — Progress Notes (Addendum)
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  Patient admits to sedentary lifestyle since she started taking care of her mother weeks ago.  Patient was recently diagnosed with right lower extremity DVT by her PCP after having 2 weeks of right lower extremity swelling and pain.  She was started on Xarelto yesterday.  Work-up in the ED reveals extensive left-sided pulmonary embolism associated with extensive left lower lobe atelectasis.  There is no CT evidence of right heart strain.  Patient started on heparin drip in the ED.   Assessment & Plan:   Principal Problem:   Acute pulmonary embolism (HCC)  Pulmonary embolism: Patient presented with left-sided chest pain associated with shortness of breath on taking deep breath.   CTA chest shows extensive left-sided pulmonary embolism. Patient was recently diagnosed with DVT and started on Xarelto which she started 07/29/21. Troponin unremarkable, BNP is normal. Patient started on heparin drip,  continue for 48 hours 2D echocardiogram completed,  report pending  Leukocytosis: Likely reactive in the setting of acute PE Remains afebrile, procalcitonin normal  Right lower extremity DVT: Diagnosed by PCP and started on Xarelto 6/14.23 Advised to elevate lower extremity Pain control as needed, Continue heparin gtt  Obesity: Diet and excercise discussed in detail. Estimated body mass index is 34.62 kg/m as calculated from the following:   Height as of this encounter: 6' (1.829 m).   Weight as of this encounter: 115.8 kg.   Non-anion gap metabolic acidosis: Continue IV hydration. Continue sodium  bicarbonate.  Hyperlipidemia: Continue Crestor 20 mg daily.  Hypomagnesemia: Replaced.  Continue to monitor  Menorrhagia on birth control pills: Continue NuvaRing. Consider curbside with gynecology regarding options in the setting of DVT and pulmonary embolism  Tobacco use: Tobacco cessation counseling done at bedside.  DVT prophylaxis: Heparin gtt Code Status: Full code. Family Communication:  No family at bed side. Disposition Plan:   Status is: Inpatient Remains inpatient appropriate because:   Admitted for pulmonary embolism requiring IV heparin.   Consultants:  None  Procedures: CTA chest, echo Antimicrobials: None  Subjective: Patient was seen and examined at bedside.  Overnight events noted. Patient reported still having some chest discomfort and some shortness of breath but better than before.  Objective: Vitals:   07/31/21 1100 07/31/21 1138 07/31/21 1350 07/31/21 1351  BP: 119/81 119/81  (!) 145/92  Pulse: (!) 107 (!) 123  99  Resp:  20  18  Temp:  97.9 F (36.6 C)  98.1 F (36.7 C)  TempSrc:  Oral    SpO2: 100% 100%  95%  Weight:   115.8 kg   Height:   6' (1.829 m)    No intake or output data in the 24 hours ending 07/31/21 1558 Filed Weights   07/30/21 1506 07/31/21 1350  Weight: 112.9 kg 115.8 kg    Examination:  General exam: Appears comfortable, not in any acute distress.  Deconditioned Respiratory system: CTA bilaterally, no wheezing, no crackles, normal respiratory effort. Cardiovascular system: S1-S2 heard, regular rate and rhythm, no murmur. Gastrointestinal system: Abdomen is soft, non tender, nondistended, BS+ Central nervous system: Alert and oriented x 3. No focal neurological deficits. Extremities:  No edema, no cyanosis, no clubbing Skin: No rashes, lesions or ulcers Psychiatry: Judgement and insight appear normal. Mood & affect appropriate.     Data Reviewed: I have personally reviewed following labs and imaging  studies  CBC: Recent Labs  Lab 07/30/21 1510 07/31/21 0345  WBC 22.7* 23.6*  NEUTROABS  --  19.8*  HGB 14.6 12.9  HCT 41.2 35.6*  MCV 91.8 89.0  PLT 281 254   Basic Metabolic Panel: Recent Labs  Lab 07/30/21 1510 07/31/21 0345  NA 136 132*  K 3.9 3.9  CL 103 99  CO2 20* 20*  GLUCOSE 125* 116*  BUN 8 10  CREATININE 1.01* 0.97  CALCIUM 9.6 8.8*  MG  --  1.4*  PHOS  --  3.3   GFR: Estimated Creatinine Clearance: 116.4 mL/min (by C-G formula based on SCr of 0.97 mg/dL). Liver Function Tests: Recent Labs  Lab 07/30/21 2145 07/31/21 0345  AST 11* 12*  ALT 12 12  ALKPHOS 53 57  BILITOT 1.2 1.0  PROT 7.3 6.9  ALBUMIN 3.4* 3.1*   No results for input(s): "LIPASE", "AMYLASE" in the last 168 hours. No results for input(s): "AMMONIA" in the last 168 hours. Coagulation Profile: Recent Labs  Lab 07/30/21 1930  INR 1.4*   Cardiac Enzymes: No results for input(s): "CKTOTAL", "CKMB", "CKMBINDEX", "TROPONINI" in the last 168 hours. BNP (last 3 results) No results for input(s): "PROBNP" in the last 8760 hours. HbA1C: No results for input(s): "HGBA1C" in the last 72 hours. CBG: No results for input(s): "GLUCAP" in the last 168 hours. Lipid Profile: No results for input(s): "CHOL", "HDL", "LDLCALC", "TRIG", "CHOLHDL", "LDLDIRECT" in the last 72 hours. Thyroid Function Tests: No results for input(s): "TSH", "T4TOTAL", "FREET4", "T3FREE", "THYROIDAB" in the last 72 hours. Anemia Panel: No results for input(s): "VITAMINB12", "FOLATE", "FERRITIN", "TIBC", "IRON", "RETICCTPCT" in the last 72 hours. Sepsis Labs: Recent Labs  Lab 07/31/21 0345  PROCALCITON 0.31    No results found for this or any previous visit (from the past 240 hour(s)).       Radiology Studies: ECHOCARDIOGRAM COMPLETE  Result Date: 07/31/2021    ECHOCARDIOGRAM REPORT   Patient Name:   Tracy Garcia Date of Exam: 07/31/2021 Medical Rec #:  [id number]  Height:       72.0 in Accession #:    [id number]  Weight:       249.0 lb Date of Birth:  Jun 25, 1985 BSA:          2.339 m Patient Age:    35 years   BP:           143/102 mmHg Patient Gender: F          HR:           106 bpm. Exam Location:  Inpatient Procedure: 2D Echo, Cardiac Doppler, Color Doppler and Intracardiac            Opacification Agent Indications:    I26.02 Pulmonary embolus  History:        Patient has no prior history of Echocardiogram examinations.                 Signs/Symptoms:Dyspnea and Shortness of Breath.  Sonographer:    Sheralyn Boatman RDCS Referring Phys: [id number] CAROLE N HALL  Sonographer Comments: Technically difficult study due to poor echo windows, suboptimal parasternal window, suboptimal apical window, suboptimal subcostal window and patient is morbidly obese. Image acquisition challenging due to patient body habitus. Very difficult images. Could not visualize RV.  IMPRESSIONS  1. Left ventricular ejection fraction, by estimation, is 55%. The left ventricle has normal function. The left ventricle has no regional wall motion abnormalities. Left ventricular diastolic parameters were normal.  2. Right ventricular systolic function is normal. The right ventricular size is normal.  3. The pericardial effusion is posterior to the left ventricle.  4. The mitral valve is normal in structure. No evidence of mitral valve regurgitation. No evidence of mitral stenosis.  5. The aortic valve is tricuspid. Aortic valve regurgitation is not visualized. No aortic stenosis is present.  6. The inferior vena cava is normal in size with greater than 50% respiratory variability, suggesting right atrial pressure of 3 mmHg. FINDINGS  Left Ventricle: Left ventricular ejection fraction, by estimation, is 55%. The left ventricle has normal function. The left ventricle has no regional wall motion abnormalities. Definity contrast agent was given IV to delineate the left ventricular endocardial borders. The left ventricular internal cavity size was normal in size.  There is no left ventricular hypertrophy. Left ventricular diastolic parameters were normal. Right Ventricle: The right ventricular size is normal. No increase in right ventricular wall thickness. Right ventricular systolic function is normal. Left Atrium: Left atrial size was normal in size. Right Atrium: Right atrial size was normal in size. Pericardium: Trivial pericardial effusion is present. The pericardial effusion is posterior to the left ventricle. Mitral Valve: The mitral valve is normal in structure. No evidence of mitral valve regurgitation. No evidence of mitral valve stenosis. Tricuspid Valve: The tricuspid valve is normal in structure. Tricuspid valve regurgitation is trivial. No evidence of tricuspid stenosis. Aortic Valve: The aortic valve is tricuspid. Aortic valve regurgitation is not visualized. No aortic stenosis is present. Pulmonic Valve: The pulmonic valve was normal in structure. Pulmonic valve regurgitation is trivial. No evidence of pulmonic stenosis. Aorta: The aortic root is normal in size and structure. Venous: The inferior vena cava is normal in size with greater than 50% respiratory variability, suggesting right atrial pressure of 3 mmHg. IAS/Shunts: No atrial level shunt detected by color flow Doppler.  LEFT VENTRICLE PLAX 2D LVIDd:         5.00 cm     Diastology LVIDs:         4.20 cm     LV e' medial:    10.80 cm/s LV PW:         1.00 cm     LV E/e' medial:  6.7 LV IVS:        1.00 cm     LV e' lateral:   14.30 cm/s LVOT diam:     2.00 cm     LV E/e' lateral: 5.1 LV SV:         61 LV SV Index:   26 LVOT Area:     3.14 cm  LV Volumes (MOD) LV vol d, MOD A2C: 62.3 ml LV vol d, MOD A4C: 80.5 ml LV vol s, MOD A2C: 35.4 ml LV vol s, MOD A4C: 25.9 ml LV SV MOD A2C:     26.9 ml LV SV MOD A4C:     80.5 ml LV SV MOD BP:      38.4 ml RIGHT VENTRICLE             IVC RV S prime:     14.20 cm/s  IVC diam: 1.50 cm TAPSE (M-mode): 1.5 cm LEFT ATRIUM             Index       RIGHT  ATRIUM           Index LA diam:        3.40 cm 1.45 cm/m  RA Area:     9.35 cm LA Vol (A2C):   14.7 ml 6.29 ml/m  RA Volume:   18.40 ml 7.87 ml/m LA Vol (A4C):   14.6 ml 6.24 ml/m LA Biplane Vol: 15.8 ml 6.76 ml/m  AORTIC VALVE LVOT Vmax:   138.00 cm/s LVOT Vmean:  90.700 cm/s LVOT VTI:    0.194 m  AORTA Ao Root diam: 3.10 cm Ao Asc diam:  3.00 cm MITRAL VALVE MV Area (PHT): 5.88 cm    SHUNTS MV Decel Time: 129 msec    Systemic VTI:  0.19 m MV E velocity: 72.33 cm/s  Systemic Diam: 2.00 cm MV A velocity: 88.63 cm/s MV E/A ratio:  0.82 Tracy Haws MD Electronically signed by Tracy Haws MD Signature Date/Time: 07/31/2021/11:29:45 AM    Final    CT Angio Chest PE W and/or Wo Contrast  Result Date: 07/30/2021 CLINICAL DATA:  Chest pain and shortness of breath. EXAM: CT ANGIOGRAPHY CHEST WITH CONTRAST TECHNIQUE: Multidetector CT imaging of the chest was performed using the standard protocol during bolus administration of intravenous contrast. Multiplanar CT image reconstructions and MIPs were obtained to evaluate the vascular anatomy. RADIATION DOSE REDUCTION: This exam was performed according to the departmental dose-optimization program which includes automated exposure control, adjustment of the mA and/or kV according to patient size and/or use of iterative reconstruction technique. CONTRAST:  82mL OMNIPAQUE IOHEXOL 350 MG/ML SOLN COMPARISON:  Chest pain and shortness of breath. Patient has a known DVT. FINDINGS: Cardiovascular: The heart is normal in size. No pericardial effusion. No evidence of right heart strain. The RV LV ratio is normal. The aorta is normal in caliber. No dissection. The branch vessels are patent. The pulmonary arterial tree is well opacified. There is a large and fairly extensive pulmonary embolism on the left side involving the left lower lobe pulmonary artery the proximal artery and its branches are completely occluded. I do not see any findings for pulmonary embolism on the right side.  Mediastinum/Nodes: No mediastinal or hilar mass or lymphadenopathy. The esophagus is grossly normal. The thyroid gland is unremarkable. Lungs/Pleura: Pronounced left lower lobe atelectasis likely associated with the pulmonary embolism. No pulmonary edema or pleural effusion. No pulmonary lesions. Upper Abdomen: No significant upper abdominal findings. Musculoskeletal: No breast masses, supraclavicular or axillary adenopathy. The bony thorax is intact. Review of the MIP images confirms the above findings. IMPRESSION: 1. Fairly extensive left-sided pulmonary embolism involving the left lower lobe pulmonary artery and its branches. No findings for right heart strain. No right-sided pulmonary emboli. 2. Pronounced left lower lobe atelectasis likely associated with the pulmonary embolism. 3. Normal thoracic aorta. Electronically Signed   By: Rudie Meyer M.D.   On: 07/30/2021 18:30    Scheduled Meds:  rosuvastatin  20 mg Oral QHS   Continuous Infusions:  heparin 2,000 Units/hr (07/31/21 1518)   lactated ringers 50 mL/hr at 07/31/21 0813     LOS: 1 day    Time spent: 50 mins    Dazaria Macneill, MD [facility]   If 7PM-7AM, please contact night-coverage

## 2021-08-01 DIAGNOSIS — I2699 Other pulmonary embolism without acute cor pulmonale: Secondary | ICD-10-CM | POA: Diagnosis not present

## 2021-08-01 LAB — APTT
aPTT: 59 seconds — ABNORMAL HIGH (ref 24–36)
aPTT: 78 seconds — ABNORMAL HIGH (ref 24–36)
aPTT: 77 seconds — ABNORMAL HIGH (ref 24–36)

## 2021-08-01 LAB — CBC
HCT: 34 % — ABNORMAL LOW (ref 36.0–46.0)
Hemoglobin: 12.4 g/dL (ref 12.0–15.0)
MCH: 32.1 pg (ref 26.0–34.0)
MCHC: 36.5 g/dL — ABNORMAL HIGH (ref 30.0–36.0)
MCV: 88.1 fL (ref 80.0–100.0)
Platelets: 223 10*3/uL (ref 150–400)
RBC: 3.86 MIL/uL — ABNORMAL LOW (ref 3.87–5.11)
RDW: 12.2 % (ref 11.5–15.5)
WBC: 24.5 10*3/uL — ABNORMAL HIGH (ref 4.0–10.5)
nRBC: 0 % (ref 0.0–0.2)

## 2021-08-01 LAB — HEPARIN LEVEL (UNFRACTIONATED)
Heparin Unfractionated: 0.41 IU/mL (ref 0.30–0.70)
Heparin Unfractionated: 0.49 IU/mL (ref 0.30–0.70)

## 2021-08-01 MED ORDER — LACTATED RINGERS IV SOLN: INTRAVENOUS | Status: DC

## 2021-08-01 NOTE — Progress Notes (Signed)
PROGRESS NOTE    Tracy Garcia  ZOX:[id number] DOB: 02/03/1986 DOA: 07/30/2021  PCP: Fayrene Helper, NP    Brief Narrative:  This 36 years old female with PMH significant for obesity, hyperlipidemia, tobacco abuse 1 PPD since the age of 39 presented to the ED with sudden onset of shortness of breath, associated with left-sided chest pain worse on taking deep breath.  Patient admits to sedentary lifestyle since she started taking care of her mother weeks ago.  Patient was recently diagnosed with right lower extremity DVT by her PCP after having 2 weeks of right lower extremity swelling and pain.  She was started on Xarelto yesterday.  Work-up in the ED reveals extensive left-sided pulmonary embolism associated with extensive left lower lobe atelectasis.  There is no CT evidence of right heart strain.  Patient started on heparin drip in the ED.   Assessment & Plan:   Principal Problem:   Acute pulmonary embolism (HCC)  Pulmonary embolism: Patient presented with left-sided chest pain associated with shortness of breath on taking deep breath.   CTA chest shows extensive left-sided pulmonary embolism. Patient was recently diagnosed with DVT and started on Xarelto which she started 07/29/21. Troponin unremarkable, BNP is normal. Patient started on heparin GTT, she remains significantly tachycardic with intermittent chest pain, will continue with heparin drip for now, and when she becomes more stable then we can transition her back to Xarelto  -2D echo with a preserved right ventricular function -She remains with significant tachycardia, leukocytosis, likely related to atelectasis, she will be encouraged to use incentive spirometer. -Given tachycardia and leukocytosis will obtain lactic acid, start on IV fluids and monitor procalcitonin, but will monitor off antibiotics for now -Appears to be provoked in the setting of her smoking and use of contraception  Leukocytosis: Likely reactive in the  setting of acute PE Please see above discussion  Right lower extremity DVT: Diagnosed by PCP and started on Xarelto 6/14.23 Advised to elevate lower extremity Pain control as needed, Continue heparin gtt Discussed with vascular surgery, no much benefit anticipated from her thrombectomy at this point  Obesity: Diet and excercise discussed in detail. Estimated body mass index is 34.62 kg/m as calculated from the following:   Height as of this encounter: 6' (1.829 m).   Weight as of this encounter: 115.8 kg.   Non-anion gap metabolic acidosis: Continue IV hydration. Continue sodium bicarbonate.  Hyperlipidemia: Continue Crestor 20 mg daily.  Hypomagnesemia: Replaced.  Continue to monitor  Menorrhagia on birth control pills: She is on NuvaRing, I have discussed with her to follow-up with her GYN after discharge to consider nonhormonal option  Tobacco use: Tobacco cessation counseling done at bedside.  DVT prophylaxis: Heparin gtt Code Status: Full code. Family Communication:  No family at bed side. Disposition Plan:   Status is: Inpatient Remains inpatient appropriate because:   Admitted for pulmonary embolism requiring IV heparin.   Consultants:  None  Procedures: CTA chest, echo Antimicrobials: None  Subjective: She reports some chest discomfort, shortness of breath as well,  Objective: Vitals:   07/31/21 2150 08/01/21 0533 08/01/21 0801 08/01/21 1225  BP: 138/80 128/78 (!) 152/68 139/81  Pulse:  (!) 133 (!) 140 (!) 140  Resp: 18 18    Temp: 98.7 F (37.1 C) 98.3 F (36.8 C) 99.4 F (37.4 C) 98.9 F (37.2 C)  TempSrc: Oral Oral Oral Oral  SpO2: 96% 96% 96% 96%  Weight:      Height:  Intake/Output Summary (Last 24 hours) at 08/01/2021 1310 Last data filed at 08/01/2021 0615 Gross per 24 hour  Intake 192 ml  Output 400 ml  Net -208 ml   Filed Weights   07/30/21 1506 07/31/21 1350  Weight: 112.9 kg 115.8 kg    Examination:  Awake Alert,  Oriented X 3, No new F.N deficits, Normal affect Symmetrical Chest wall movement, Good air movement bilaterally, CTAB Tachycardia,No Gallops,Rubs or new Murmurs, No Parasternal Heave +ve B.Sounds, Abd Soft, No tenderness, No rebound - guarding or rigidity. No Cyanosis, Clubbing, right lower extremity edema, No new Rash or bruise      Data Reviewed: I have personally reviewed following labs and imaging studies  CBC: Recent Labs  Lab 07/30/21 1510 07/31/21 0345 08/01/21 0408  WBC 22.7* 23.6* 24.5*  NEUTROABS  --  19.8*  --   HGB 14.6 12.9 12.4  HCT 41.2 35.6* 34.0*  MCV 91.8 89.0 88.1  PLT 281 254 223   Basic Metabolic Panel: Recent Labs  Lab 07/30/21 1510 07/31/21 0345  NA 136 132*  K 3.9 3.9  CL 103 99  CO2 20* 20*  GLUCOSE 125* 116*  BUN 8 10  CREATININE 1.01* 0.97  CALCIUM 9.6 8.8*  MG  --  1.4*  PHOS  --  3.3   GFR: Estimated Creatinine Clearance: 116.4 mL/min (by C-G formula based on SCr of 0.97 mg/dL). Liver Function Tests: Recent Labs  Lab 07/30/21 2145 07/31/21 0345  AST 11* 12*  ALT 12 12  ALKPHOS 53 57  BILITOT 1.2 1.0  PROT 7.3 6.9  ALBUMIN 3.4* 3.1*   No results for input(s): "LIPASE", "AMYLASE" in the last 168 hours. No results for input(s): "AMMONIA" in the last 168 hours. Coagulation Profile: Recent Labs  Lab 07/30/21 1930  INR 1.4*   Cardiac Enzymes: No results for input(s): "CKTOTAL", "CKMB", "CKMBINDEX", "TROPONINI" in the last 168 hours. BNP (last 3 results) No results for input(s): "PROBNP" in the last 8760 hours. HbA1C: No results for input(s): "HGBA1C" in the last 72 hours. CBG: No results for input(s): "GLUCAP" in the last 168 hours. Lipid Profile: No results for input(s): "CHOL", "HDL", "LDLCALC", "TRIG", "CHOLHDL", "LDLDIRECT" in the last 72 hours. Thyroid Function Tests: No results for input(s): "TSH", "T4TOTAL", "FREET4", "T3FREE", "THYROIDAB" in the last 72 hours. Anemia Panel: No results for input(s): "VITAMINB12",  "FOLATE", "FERRITIN", "TIBC", "IRON", "RETICCTPCT" in the last 72 hours. Sepsis Labs: Recent Labs  Lab 07/31/21 0345  PROCALCITON 0.31    No results found for this or any previous visit (from the past 240 hour(s)).       Radiology Studies: ECHOCARDIOGRAM COMPLETE  Result Date: 07/31/2021    ECHOCARDIOGRAM REPORT   Patient Name:   Saige Mcmanamon Date of Exam: 07/31/2021 Medical Rec #:  [id number]  Height:       72.0 in Accession #:    [id number] Weight:       249.0 lb Date of Birth:  02-11-1986 BSA:          2.339 m Patient Age:    35 years   BP:           143/102 mmHg Patient Gender: F          HR:           106 bpm. Exam Location:  Inpatient Procedure: 2D Echo, Cardiac Doppler, Color Doppler and Intracardiac            Opacification Agent Indications:    I26.02 Pulmonary embolus  History:        Patient has no prior history of Echocardiogram examinations.                 Signs/Symptoms:Dyspnea and Shortness of Breath.  Sonographer:    Sheralyn Boatman RDCS Referring Phys: [id number] CAROLE N HALL  Sonographer Comments: Technically difficult study due to poor echo windows, suboptimal parasternal window, suboptimal apical window, suboptimal subcostal window and patient is morbidly obese. Image acquisition challenging due to patient body habitus. Very difficult images. Could not visualize RV. IMPRESSIONS  1. Left ventricular ejection fraction, by estimation, is 55%. The left ventricle has normal function. The left ventricle has no regional wall motion abnormalities. Left ventricular diastolic parameters were normal.  2. Right ventricular systolic function is normal. The right ventricular size is normal.  3. The pericardial effusion is posterior to the left ventricle.  4. The mitral valve is normal in structure. No evidence of mitral valve regurgitation. No evidence of mitral stenosis.  5. The aortic valve is tricuspid. Aortic valve regurgitation is not visualized. No aortic stenosis is present.  6. The inferior vena  cava is normal in size with greater than 50% respiratory variability, suggesting right atrial pressure of 3 mmHg. FINDINGS  Left Ventricle: Left ventricular ejection fraction, by estimation, is 55%. The left ventricle has normal function. The left ventricle has no regional wall motion abnormalities. Definity contrast agent was given IV to delineate the left ventricular endocardial borders. The left ventricular internal cavity size was normal in size. There is no left ventricular hypertrophy. Left ventricular diastolic parameters were normal. Right Ventricle: The right ventricular size is normal. No increase in right ventricular wall thickness. Right ventricular systolic function is normal. Left Atrium: Left atrial size was normal in size. Right Atrium: Right atrial size was normal in size. Pericardium: Trivial pericardial effusion is present. The pericardial effusion is posterior to the left ventricle. Mitral Valve: The mitral valve is normal in structure. No evidence of mitral valve regurgitation. No evidence of mitral valve stenosis. Tricuspid Valve: The tricuspid valve is normal in structure. Tricuspid valve regurgitation is trivial. No evidence of tricuspid stenosis. Aortic Valve: The aortic valve is tricuspid. Aortic valve regurgitation is not visualized. No aortic stenosis is present. Pulmonic Valve: The pulmonic valve was normal in structure. Pulmonic valve regurgitation is trivial. No evidence of pulmonic stenosis. Aorta: The aortic root is normal in size and structure. Venous: The inferior vena cava is normal in size with greater than 50% respiratory variability, suggesting right atrial pressure of 3 mmHg. IAS/Shunts: No atrial level shunt detected by color flow Doppler.  LEFT VENTRICLE PLAX 2D LVIDd:         5.00 cm     Diastology LVIDs:         4.20 cm     LV e' medial:    10.80 cm/s LV PW:         1.00 cm     LV E/e' medial:  6.7 LV IVS:        1.00 cm     LV e' lateral:   14.30 cm/s LVOT diam:     2.00  cm     LV E/e' lateral: 5.1 LV SV:         61 LV SV Index:   26 LVOT Area:     3.14 cm  LV Volumes (MOD) LV vol d, MOD A2C: 62.3 ml LV vol d, MOD A4C: 80.5 ml LV vol s, MOD A2C: 35.4 ml LV  vol s, MOD A4C: 25.9 ml LV SV MOD A2C:     26.9 ml LV SV MOD A4C:     80.5 ml LV SV MOD BP:      38.4 ml RIGHT VENTRICLE             IVC RV S prime:     14.20 cm/s  IVC diam: 1.50 cm TAPSE (M-mode): 1.5 cm LEFT ATRIUM             Index       RIGHT ATRIUM          Index LA diam:        3.40 cm 1.45 cm/m  RA Area:     9.35 cm LA Vol (A2C):   14.7 ml 6.29 ml/m  RA Volume:   18.40 ml 7.87 ml/m LA Vol (A4C):   14.6 ml 6.24 ml/m LA Biplane Vol: 15.8 ml 6.76 ml/m  AORTIC VALVE LVOT Vmax:   138.00 cm/s LVOT Vmean:  90.700 cm/s LVOT VTI:    0.194 m  AORTA Ao Root diam: 3.10 cm Ao Asc diam:  3.00 cm MITRAL VALVE MV Area (PHT): 5.88 cm    SHUNTS MV Decel Time: 129 msec    Systemic VTI:  0.19 m MV E velocity: 72.33 cm/s  Systemic Diam: 2.00 cm MV A velocity: 88.63 cm/s MV E/A ratio:  0.82 Charlton Haws MD Electronically signed by Charlton Haws MD Signature Date/Time: 07/31/2021/11:29:45 AM    Final    CT Angio Chest PE W and/or Wo Contrast  Result Date: 07/30/2021 CLINICAL DATA:  Chest pain and shortness of breath. EXAM: CT ANGIOGRAPHY CHEST WITH CONTRAST TECHNIQUE: Multidetector CT imaging of the chest was performed using the standard protocol during bolus administration of intravenous contrast. Multiplanar CT image reconstructions and MIPs were obtained to evaluate the vascular anatomy. RADIATION DOSE REDUCTION: This exam was performed according to the departmental dose-optimization program which includes automated exposure control, adjustment of the mA and/or kV according to patient size and/or use of iterative reconstruction technique. CONTRAST:  31mL OMNIPAQUE IOHEXOL 350 MG/ML SOLN COMPARISON:  Chest pain and shortness of breath. Patient has a known DVT. FINDINGS: Cardiovascular: The heart is normal in size. No pericardial  effusion. No evidence of right heart strain. The RV LV ratio is normal. The aorta is normal in caliber. No dissection. The branch vessels are patent. The pulmonary arterial tree is well opacified. There is a large and fairly extensive pulmonary embolism on the left side involving the left lower lobe pulmonary artery the proximal artery and its branches are completely occluded. I do not see any findings for pulmonary embolism on the right side. Mediastinum/Nodes: No mediastinal or hilar mass or lymphadenopathy. The esophagus is grossly normal. The thyroid gland is unremarkable. Lungs/Pleura: Pronounced left lower lobe atelectasis likely associated with the pulmonary embolism. No pulmonary edema or pleural effusion. No pulmonary lesions. Upper Abdomen: No significant upper abdominal findings. Musculoskeletal: No breast masses, supraclavicular or axillary adenopathy. The bony thorax is intact. Review of the MIP images confirms the above findings. IMPRESSION: 1. Fairly extensive left-sided pulmonary embolism involving the left lower lobe pulmonary artery and its branches. No findings for right heart strain. No right-sided pulmonary emboli. 2. Pronounced left lower lobe atelectasis likely associated with the pulmonary embolism. 3. Normal thoracic aorta. Electronically Signed   By: Rudie Meyer M.D.   On: 07/30/2021 18:30    Scheduled Meds:  rosuvastatin  20 mg Oral QHS   Continuous Infusions:  heparin  2,350 Units/hr (08/01/21 0800)     LOS: 2 days       Huey Bienenstock, MD [facility]   If 7PM-7AM, please contact night-coverage

## 2021-08-01 NOTE — Progress Notes (Signed)
ANTICOAGULATION CONSULT NOTE - Follow Up Consult  Pharmacy Consult for Heparin infusion  Indication: pulmonary embolus and RLE DVT  Allergies  Allergen Reactions   Shellfish Allergy Hives    Itching     Patient Measurements: Height: 6' (182.9 cm) Weight: 115.8 kg (255 lb 4.7 oz) IBW/kg (Calculated) : 73.1 Heparin Dosing Weight: 100 kg  Vital Signs: Temp: 98.9 F (37.2 C) (06/17 1225) Temp Source: Oral (06/17 1225) BP: 139/81 (06/17 1225) Pulse Rate: 140 (06/17 1225)  Labs: Recent Labs    07/30/21 1510 07/30/21 1749 07/30/21 1930 07/30/21 1930 07/31/21 0345 07/31/21 1259 07/31/21 2033 08/01/21 0408 08/01/21 1212  HGB 14.6  --   --   --  12.9  --   --  12.4  --   HCT 41.2  --   --   --  35.6*  --   --  34.0*  --   PLT 281  --   --   --  254  --   --  223  --   APTT  --   --   --   --  55*   < > 58* 59* 78*  LABPROT  --   --  16.6*  --   --   --   --   --   --   INR  --   --  1.4*  --   --   --   --   --   --   HEPARINUNFRC  --   --  0.85*   < > 0.64  --   --  0.41 0.49  CREATININE 1.01*  --   --   --  0.97  --   --   --   --   TROPONINIHS 4 5  --   --   --   --   --   --   --    < > = values in this interval not displayed.     Estimated Creatinine Clearance: 116.4 mL/min (by C-G formula based on SCr of 0.97 mg/dL).   Medications:  Medications Prior to Admission  Medication Sig Dispense Refill Last Dose   NURTEC 75 MG TBDP Take 1 tablet by mouth daily as needed (pain).   Past Week   NUVARING 0.12-0.015 MG/24HR vaginal ring Place 1 each vaginally every 28 (twenty-eight) days.   07/30/2021 at ongoing   Rivaroxaban (XARELTO STARTER PACK PO) Take 15-20 mg by mouth as directed. Take 15 mg tablet BID for 21 Days. Then On Day 22 Switch to Take 20 mg tablet Daily   07/29/2021 at 1600   rosuvastatin (CRESTOR) 20 MG tablet Take 20 mg by mouth at bedtime.   07/29/2021    Assessment: 36 yo F presented to ED with SOB, L-sided chest pain worsening when taking deep breaths  and nonproductive cough. Diagnosed with DVT recently by PCP and initiated on Xarelto. CT PE in ED positive for L-sided PE. No evidence of right heart strain. Pharmacy consulted for heparin infusion for PE/DVT.  Last dose of Xarelto was 6/14 @ 1600.   PM update: aPTT of 58 is subtherapeutic on heparin 2000 units/hr. aPTT decrease with heparin rate increase. Per RN no issues with IV access or infusion. No bleeding noted per RN.   6/17, the 6-8 hour  aPTT =78 and HL is 0.49 after heparin rate increased to 2350 units/hr early this AM.   aPTT is therapeutic.  HL possibly still effected by prior Xarelto.   Hgb  stable/ wnl at 12.4,  Hct 34 low, pltc stable wnl at 223k.  No bleeding reported.   Goal of Therapy:  aPTT 66-102 Heparin level 0.3-0.7 units/ml Monitor platelets by anticoagulation protocol: Yes   Plan:  Continue Heparin  2350 units/hr  Check confirmatory aPTT/ HL in 6 hours. Daily  aPTT, heparin level and CBC.   Noah Delaine, RPh Clinical Pharmacist [phone] 08/01/2021 2:05 PM  Please check AMION for all [facility] Pharmacy phone numbers After 10:00 PM, call Main Pharmacy [phone]

## 2021-08-01 NOTE — Progress Notes (Signed)
ANTICOAGULATION CONSULT NOTE - Follow Up Consult  Pharmacy Consult for Heparin infusion  Indication: pulmonary embolus and RLE DVT  Allergies  Allergen Reactions   Shellfish Allergy Hives    Itching     Patient Measurements: Height: 6' (182.9 cm) Weight: 115.8 kg (255 lb 4.7 oz) IBW/kg (Calculated) : 73.1 Heparin Dosing Weight: 100 kg  Vital Signs: Temp: 99 F (37.2 C) (06/17 1513) Temp Source: Oral (06/17 1513) BP: 156/105 (06/17 1513) Pulse Rate: 149 (06/17 1513)  Labs: Recent Labs    07/30/21 1510 07/30/21 1749 07/30/21 1930 07/30/21 1930 07/31/21 0345 07/31/21 1259 08/01/21 0408 08/01/21 1212 08/01/21 1839  HGB 14.6  --   --   --  12.9  --  12.4  --   --   HCT 41.2  --   --   --  35.6*  --  34.0*  --   --   PLT 281  --   --   --  254  --  223  --   --   APTT  --   --   --   --  55*   < > 59* 78* 77*  LABPROT  --   --  16.6*  --   --   --   --   --   --   INR  --   --  1.4*  --   --   --   --   --   --   HEPARINUNFRC  --   --  0.85*   < > 0.64  --  0.41 0.49  --   CREATININE 1.01*  --   --   --  0.97  --   --   --   --   TROPONINIHS 4 5  --   --   --   --   --   --   --    < > = values in this interval not displayed.     Estimated Creatinine Clearance: 116.4 mL/min (by C-G formula based on SCr of 0.97 mg/dL).   Medications:  Medications Prior to Admission  Medication Sig Dispense Refill Last Dose   NURTEC 75 MG TBDP Take 1 tablet by mouth daily as needed (pain).   Past Week   NUVARING 0.12-0.015 MG/24HR vaginal ring Place 1 each vaginally every 28 (twenty-eight) days.   07/30/2021 at ongoing   Rivaroxaban (XARELTO STARTER PACK PO) Take 15-20 mg by mouth as directed. Take 15 mg tablet BID for 21 Days. Then On Day 22 Switch to Take 20 mg tablet Daily   07/29/2021 at 1600   rosuvastatin (CRESTOR) 20 MG tablet Take 20 mg by mouth at bedtime.   07/29/2021    Assessment: 36 yo F presented to ED with SOB, L-sided chest pain worsening when taking deep breaths and  nonproductive cough. Diagnosed with DVT recently by PCP and initiated on Xarelto. CT PE in ED positive for L-sided PE. No evidence of right heart strain. Pharmacy consulted for heparin infusion for PE/DVT.  Last dose of Xarelto was 6/14 @ 1600.   aPTT remains therapeutic at 77 this evening. No infusion or bleeding issues noted.   Goal of Therapy:  aPTT 66-102 Heparin level 0.3-0.7 units/ml Monitor platelets by anticoagulation protocol: Yes   Plan:  Continue Heparin 2350 units/hr  Daily aPTT, heparin level and CBC.    Thank you for allowing [place] to participate in this patients care. Signe Colt, PharmD 08/01/2021 7:17 PM  **Pharmacist phone  directory can be found on [email or web address] listed under [facility]**

## 2021-08-01 NOTE — Progress Notes (Signed)
ANTICOAGULATION CONSULT NOTE - Follow Up Consult  Pharmacy Consult for Heparin infusion  Indication: pulmonary embolus  Allergies  Allergen Reactions   Shellfish Allergy Hives    Itching     Patient Measurements: Height: 6' (182.9 cm) Weight: 115.8 kg (255 lb 4.7 oz) IBW/kg (Calculated) : 73.1 Heparin Dosing Weight: 100 kg  Vital Signs: Temp: 98.7 F (37.1 C) (06/16 2150) Temp Source: Oral (06/16 2150) BP: 138/80 (06/16 2150) Pulse Rate: 133 (06/16 1900)  Labs: Recent Labs    07/30/21 1510 07/30/21 1510 07/30/21 1749 07/30/21 1930 07/31/21 0345 07/31/21 1259 07/31/21 2033 08/01/21 0408  HGB 14.6  --   --   --  12.9  --   --  12.4  HCT 41.2  --   --   --  35.6*  --   --  34.0*  PLT 281  --   --   --  254  --   --  223  APTT  --    < >  --   --  55* 66* 58* 59*  LABPROT  --   --   --  16.6*  --   --   --   --   INR  --   --   --  1.4*  --   --   --   --   HEPARINUNFRC  --   --   --  0.85* 0.64  --   --  0.41  CREATININE 1.01*  --   --   --  0.97  --   --   --   TROPONINIHS 4  --  5  --   --   --   --   --    < > = values in this interval not displayed.     Estimated Creatinine Clearance: 116.4 mL/min (by C-G formula based on SCr of 0.97 mg/dL).   Medications:  Medications Prior to Admission  Medication Sig Dispense Refill Last Dose   NURTEC 75 MG TBDP Take 1 tablet by mouth daily as needed (pain).   Past Week   NUVARING 0.12-0.015 MG/24HR vaginal ring Place 1 each vaginally every 28 (twenty-eight) days.   07/30/2021 at ongoing   Rivaroxaban (XARELTO STARTER PACK PO) Take 15-20 mg by mouth as directed. Take 15 mg tablet BID for 21 Days. Then On Day 22 Switch to Take 20 mg tablet Daily   07/29/2021 at 1600   rosuvastatin (CRESTOR) 20 MG tablet Take 20 mg by mouth at bedtime.   07/29/2021    Assessment: 36 yo F presented to ED with SOB, L-sided chest pain worsening when taking deep breaths and nonproductive cough. Diagnosed with DVT recently by PCP and initiated  on Xarelto. CT PE in ED positive for L-sided PE. No evidence of right heart strain. Pharmacy consulted for heparin infusion for PE/DVT.  Last dose of Xarelto was 6/14 @ 1600.   PM update: aPTT of 58 is subtherapeutic on heparin 2000 units/hr. aPTT decrease with heparin rate increase. Per RN no issues with IV access or infusion. No bleeding noted per RN.   6/17 AM update:  aPTT below goal  Goal of Therapy:  aPTT 66-102 Heparin level 0.3-0.7 units/ml Monitor platelets by anticoagulation protocol: Yes   Plan:  Increase heparin to 2350 units/hr  Check 6-8 hr aPTT and heparin level   Abran Duke, PharmD, BCPS Clinical Pharmacist Phone: [phone]

## 2021-08-02 ENCOUNTER — Other Ambulatory Visit (HOSPITAL_COMMUNITY): Payer: 59

## 2021-08-02 ENCOUNTER — Inpatient Hospital Stay (HOSPITAL_COMMUNITY): Payer: 59

## 2021-08-02 DIAGNOSIS — F419 Anxiety disorder, unspecified: Secondary | ICD-10-CM

## 2021-08-02 DIAGNOSIS — I2699 Other pulmonary embolism without acute cor pulmonale: Secondary | ICD-10-CM

## 2021-08-02 DIAGNOSIS — I824Y1 Acute embolism and thrombosis of unspecified deep veins of right proximal lower extremity: Secondary | ICD-10-CM | POA: Diagnosis not present

## 2021-08-02 DIAGNOSIS — E7800 Pure hypercholesterolemia, unspecified: Secondary | ICD-10-CM

## 2021-08-02 DIAGNOSIS — R0602 Shortness of breath: Secondary | ICD-10-CM | POA: Diagnosis not present

## 2021-08-02 DIAGNOSIS — A419 Sepsis, unspecified organism: Secondary | ICD-10-CM

## 2021-08-02 DIAGNOSIS — J90 Pleural effusion, not elsewhere classified: Secondary | ICD-10-CM

## 2021-08-02 DIAGNOSIS — J942 Hemothorax: Secondary | ICD-10-CM | POA: Diagnosis not present

## 2021-08-02 DIAGNOSIS — J869 Pyothorax without fistula: Secondary | ICD-10-CM

## 2021-08-02 LAB — CBC
HCT: 35 % — ABNORMAL LOW (ref 36.0–46.0)
Hemoglobin: 12.5 g/dL (ref 12.0–15.0)
MCH: 31.6 pg (ref 26.0–34.0)
MCHC: 35.7 g/dL (ref 30.0–36.0)
MCV: 88.6 fL (ref 80.0–100.0)
Platelets: 249 10*3/uL (ref 150–400)
RBC: 3.95 MIL/uL (ref 3.87–5.11)
RDW: 12 % (ref 11.5–15.5)
WBC: 22.7 10*3/uL — ABNORMAL HIGH (ref 4.0–10.5)
nRBC: 0 % (ref 0.0–0.2)

## 2021-08-02 LAB — BASIC METABOLIC PANEL
Anion gap: 11 (ref 5–15)
BUN: 8 mg/dL (ref 6–20)
CO2: 20 mmol/L — ABNORMAL LOW (ref 22–32)
Calcium: 8.4 mg/dL — ABNORMAL LOW (ref 8.9–10.3)
Chloride: 102 mmol/L (ref 98–111)
Creatinine, Ser: 0.87 mg/dL (ref 0.44–1.00)
GFR, Estimated: >60 mL/min (ref >60)
Glucose, Bld: 120 mg/dL — ABNORMAL HIGH (ref 70–99)
Potassium: 3.7 mmol/L (ref 3.5–5.1)
Sodium: 133 mmol/L — ABNORMAL LOW (ref 135–145)

## 2021-08-02 LAB — PROCALCITONIN: Procalcitonin: 0.7 ng/mL

## 2021-08-02 LAB — LACTIC ACID, PLASMA
Lactic Acid, Venous: 0.7 mmol/L (ref 0.5–1.9)
Lactic Acid, Venous: 1.2 mmol/L (ref 0.5–1.9)
Lactic Acid, Venous: 1.5 mmol/L (ref 0.5–1.9)

## 2021-08-02 LAB — TROPONIN I (HIGH SENSITIVITY)
Troponin I (High Sensitivity): 12 ng/L (ref <18)
Troponin I (High Sensitivity): 10 ng/L (ref <18)

## 2021-08-02 LAB — ECHOCARDIOGRAM LIMITED
Height: 72 in
Weight: 4084.68 oz

## 2021-08-02 LAB — MRSA NEXT GEN BY PCR, NASAL: MRSA by PCR Next Gen: NOT DETECTED

## 2021-08-02 LAB — ABO/RH: ABO/RH(D): A POS

## 2021-08-02 LAB — APTT: aPTT: 65 seconds — ABNORMAL HIGH (ref 24–36)

## 2021-08-02 LAB — HEPARIN LEVEL (UNFRACTIONATED)
Heparin Unfractionated: 0.34 IU/mL (ref 0.30–0.70)
Heparin Unfractionated: <0.1 IU/mL — ABNORMAL LOW (ref 0.30–0.70)

## 2021-08-02 LAB — TSH: TSH: 3.108 u[IU]/mL (ref 0.350–4.500)

## 2021-08-02 MED ORDER — VANCOMYCIN HCL 10 G IV SOLR
2500.0000 mg | Freq: Once | INTRAVENOUS | Status: AC
  Administered 2021-08-02: 2500 mg via INTRAVENOUS
  Filled 2021-08-02: qty 2500
  Filled 2021-08-02: qty 25

## 2021-08-02 MED ORDER — IOHEXOL 350 MG/ML SOLN
100.0000 mL | Freq: Once | INTRAVENOUS | Status: AC | PRN
  Administered 2021-08-02: 100 mL via INTRAVENOUS

## 2021-08-02 MED ORDER — CHLORHEXIDINE GLUCONATE CLOTH 2 % EX PADS
6.0000 | MEDICATED_PAD | Freq: Every day | CUTANEOUS | Status: DC
Start: 2021-08-02 — End: 2021-08-10
  Administered 2021-08-02 – 2021-08-09 (×10): 6 via TOPICAL

## 2021-08-02 MED ORDER — BENZONATATE 100 MG PO CAPS
100.0000 mg | ORAL_CAPSULE | Freq: Three times a day (TID) | ORAL | Status: DC | PRN
  Administered 2021-08-02: 100 mg via ORAL
  Filled 2021-08-02: qty 1

## 2021-08-02 MED ORDER — MIDAZOLAM HCL 2 MG/2ML IJ SOLN
INTRAMUSCULAR | Status: AC
  Administered 2021-08-02: 1 mg
  Filled 2021-08-02: qty 2

## 2021-08-02 MED ORDER — VANCOMYCIN HCL 1250 MG/250ML IV SOLN
1250.0000 mg | Freq: Two times a day (BID) | INTRAVENOUS | Status: DC
Start: 2021-08-03 — End: 2021-08-09
  Administered 2021-08-02 – 2021-08-09 (×12): 1250 mg via INTRAVENOUS
  Filled 2021-08-02 (×14): qty 250

## 2021-08-02 MED ORDER — LACTATED RINGERS IV BOLUS
2000.0000 mL | Freq: Once | INTRAVENOUS | Status: AC
  Administered 2021-08-02: 1000 mL via INTRAVENOUS

## 2021-08-02 MED ORDER — CEFAZOLIN SODIUM-DEXTROSE 2-4 GM/100ML-% IV SOLN
2.0000 g | INTRAVENOUS | Status: AC
  Administered 2021-08-03: 2 g via INTRAVENOUS
  Filled 2021-08-02: qty 100

## 2021-08-02 MED ORDER — OXYCODONE HCL 5 MG PO TABS
5.0000 mg | ORAL_TABLET | Freq: Four times a day (QID) | ORAL | Status: DC | PRN
  Administered 2021-08-02: 10 mg via ORAL
  Filled 2021-08-02: qty 2

## 2021-08-02 MED ORDER — ALPRAZOLAM 0.5 MG PO TABS
0.5000 mg | ORAL_TABLET | Freq: Four times a day (QID) | ORAL | Status: DC | PRN
  Administered 2021-08-02: 0.5 mg via ORAL
  Filled 2021-08-02: qty 2

## 2021-08-02 MED ORDER — FENTANYL CITRATE (PF) 100 MCG/2ML IJ SOLN
INTRAMUSCULAR | Status: AC
  Administered 2021-08-02: 50 ug
  Filled 2021-08-02: qty 2

## 2021-08-02 MED ORDER — CEFAZOLIN SODIUM-DEXTROSE 2-4 GM/100ML-% IV SOLN
2.0000 g | INTRAVENOUS | Status: DC
  Filled 2021-08-02: qty 100

## 2021-08-02 MED ORDER — CEFEPIME HCL 2 G IV SOLR
2.0000 g | Freq: Three times a day (TID) | INTRAVENOUS | Status: DC
Start: 2021-08-02 — End: 2021-08-10
  Administered 2021-08-02 – 2021-08-10 (×24): 2 g via INTRAVENOUS
  Filled 2021-08-02 (×26): qty 12.5

## 2021-08-02 NOTE — Progress Notes (Signed)
Patient remains persistently tachycardic in the 140s, as well now tachypneic in the upper 30s, she remains on heparin drip, will obtain stat CTA chest PE protocol to rule out any new clot burden, will obtain 2D echo as well to evaluate for right heart strain, PCCM has been consulted. Huey Bienenstock MD

## 2021-08-02 NOTE — Progress Notes (Signed)
[facility] PCCM  Dyspnea improved somewhat after treatment for anxiety and pain However remains critically ill, will monitor in ICU prior to surgery tomorrow OK to have clears tonight until midnight Will clarify with pharmacy and TCTS when to stop heparin infusion again. Will add prn xanax, continue oxycodone  Heber [place], MD [place] [facility] Pager: [phone] Cell: [phone] After 7:00 pm call Elink  [phone]

## 2021-08-02 NOTE — Progress Notes (Signed)
PROGRESS NOTE    Tracy Garcia  WUJ:[id number] DOB: May 07, 1985 DOA: 07/30/2021  PCP: Fayrene Helper, NP    Brief Narrative:   This 36 years old female with PMH significant for obesity, hyperlipidemia, tobacco abuse 1 PPD since the age of 89 presented to the ED with sudden onset of shortness of breath, associated with left-sided chest pain worse on taking deep breath.  Patient admits to sedentary lifestyle since she started taking care of her mother weeks ago.  Patient was recently diagnosed with right lower extremity DVT by her PCP after having 2 weeks of right lower extremity swelling and pain.  She was started on Xarelto yesterday.  Work-up in the ED reveals extensive left-sided pulmonary embolism associated with extensive left lower lobe atelectasis.  There is no CT evidence of right heart strain.  Patient started on heparin drip in the ED, There was evidence of pulmonary infarction on imaging on admission, patient was kept on heparin GTT, she remained with persistent tachypnea, tachycardia, repeat imaging 6/18 showing complicated left pleural effusion with possible empyema.   Assessment & Plan:   Principal Problem:   Acute pulmonary embolism (HCC) Active Problems:   Pulmonary infarction (HCC)   DVT, lower extremity (HCC)   Tachycardia   Chest pain   Pleural effusion on right   Sepsis (HCC)   Anxiety   High cholesterol  Pulmonary embolism: Patient presented with left-sided chest pain associated with shortness of breath on taking deep breath.   CTA chest shows extensive left-sided pulmonary embolism. Patient was recently diagnosed with DVT and started on Xarelto which she started 07/29/21. Troponin unremarkable, BNP is normal. Patient started on heparin GTT, she remains significantly tachycardic with intermittent chest pain, will continue with heparin drip for now, and when she becomes more stable then we can transition her back to Xarelto  -2D echo with a preserved right  ventricular function -Appears to be provoked in the setting of her smoking and use of contraception  Complicated right pleural effusion with possible empyema -Patient remains significantly tachypneic, tachycardic with increased work of breathing reporting pleuritic chest pain -Repeat CTA chest is obtained to ensure there is no worsening clot burden, as well 2D echo has been obtained emergently to evaluate for increased right heart strain , no evidence of worsening clot burden or right heart strain, but work-up significant for possible empyema was complicated effusion likely related to pulmonary infarct. [facility], patient will need chest tube, for now hold heparin GTT, may need lytics. -Darted on IV vancomycin and cefepime  Right lower extremity DVT: Diagnosed by PCP and started on Xarelto 6/14.23 Advised to elevate lower extremity Pain control as needed, Continue heparin gtt Discussed with vascular surgery, no much benefit anticipated from her thrombectomy at this point  Obesity: Diet and excercise discussed in detail. Estimated body mass index is 34.62 kg/m as calculated from the following:   Height as of this encounter: 6' (1.829 m).   Weight as of this encounter: 115.8 kg.   Non-anion gap metabolic acidosis: Continue IV hydration. Continue sodium bicarbonate.  Hyperlipidemia: Continue Crestor 20 mg daily.  Hypomagnesemia: Replaced.  Continue to monitor  Menorrhagia on birth control pills: She is on NuvaRing, I have discussed with her to follow-up with her GYN after discharge to consider nonhormonal option  Tobacco use: Tobacco cessation counseling done at bedside.  DVT prophylaxis: Heparin gtt Code Status: Full code. Family Communication:  No family at bed side.  Discussed with mother by phone, updated her  about patient status and transferred to ICU. Disposition Plan:   Status is: Inpatient Remains inpatient appropriate because:   Admitted for pulmonary  embolism requiring IV heparin.   Consultants:  None  Procedures: CTA chest, echo Antimicrobials: None  Subjective: She reports some chest discomfort, shortness of breath as well,  Objective: Vitals:   08/02/21 0718 08/02/21 0748 08/02/21 0831 08/02/21 0834  BP: (!) 128/99 (!) 141/82 121/78 121/78  Pulse: (!) 145 (!) 144  (!) 146  Resp: (!) 34 (!) 29  (!) 27  Temp:  98 F (36.7 C)    TempSrc:  Oral    SpO2: 95% 96%  96%  Weight:      Height:        Intake/Output Summary (Last 24 hours) at 08/02/2021 1023 Last data filed at 08/02/2021 0606 Gross per 24 hour  Intake 1185 ml  Output 800 ml  Net 385 ml   Filed Weights   07/30/21 1506 07/31/21 1350  Weight: 112.9 kg 115.8 kg    Examination:  Awake Alert, Oriented X 3, ill-appearing. Symmetrical Chest wall movement, [place], increased work of breathing and diminished air entry with rales in left lung RRR,No Gallops,Rubs or new Murmurs, No Parasternal Heave +ve B.Sounds, Abd Soft, No tenderness, No rebound - guarding or rigidity. No Cyanosis, Clubbing, trace edema and right leg , No new Rash or bruise       Data Reviewed: I have personally reviewed following labs and imaging studies  CBC: Recent Labs  Lab 07/30/21 1510 07/31/21 0345 08/01/21 0408 08/02/21 0131  WBC 22.7* 23.6* 24.5* 22.7*  NEUTROABS  --  19.8*  --   --   HGB 14.6 12.9 12.4 12.5  HCT 41.2 35.6* 34.0* 35.0*  MCV 91.8 89.0 88.1 88.6  PLT 281 254 223 249   Basic Metabolic Panel: Recent Labs  Lab 07/30/21 1510 07/31/21 0345 08/02/21 0131  NA 136 132* 133*  K 3.9 3.9 3.7  CL 103 99 102  CO2 20* 20* 20*  GLUCOSE 125* 116* 120*  BUN 8 10 8   CREATININE 1.01* 0.97 0.87  CALCIUM 9.6 8.8* 8.4*  MG  --  1.4*  --   PHOS  --  3.3  --    GFR: Estimated Creatinine Clearance: 129.7 mL/min (by C-G formula based on SCr of 0.87 mg/dL). Liver Function Tests: Recent Labs  Lab 07/30/21 2145 07/31/21 0345  AST 11* 12*  ALT 12 12  ALKPHOS 53 57   BILITOT 1.2 1.0  PROT 7.3 6.9  ALBUMIN 3.4* 3.1*   No results for input(s): "LIPASE", "AMYLASE" in the last 168 hours. No results for input(s): "AMMONIA" in the last 168 hours. Coagulation Profile: Recent Labs  Lab 07/30/21 1930  INR 1.4*   Cardiac Enzymes: No results for input(s): "CKTOTAL", "CKMB", "CKMBINDEX", "TROPONINI" in the last 168 hours. BNP (last 3 results) No results for input(s): "PROBNP" in the last 8760 hours. HbA1C: No results for input(s): "HGBA1C" in the last 72 hours. CBG: No results for input(s): "GLUCAP" in the last 168 hours. Lipid Profile: No results for input(s): "CHOL", "HDL", "LDLCALC", "TRIG", "CHOLHDL", "LDLDIRECT" in the last 72 hours. Thyroid Function Tests: Recent Labs    08/02/21 0131  TSH 3.108   Anemia Panel: No results for input(s): "VITAMINB12", "FOLATE", "FERRITIN", "TIBC", "IRON", "RETICCTPCT" in the last 72 hours. Sepsis Labs: Recent Labs  Lab 07/31/21 0345 08/02/21 0131  PROCALCITON 0.31 0.70  LATICACIDVEN  --  0.7    No results found for this or  any previous visit (from the past 240 hour(s)).       Radiology Studies: CT Angio Chest Pulmonary Embolism (PE) W or WO Contrast  Result Date: 08/02/2021 CLINICAL DATA:  Pulmonary embolus. EXAM: CT ANGIOGRAPHY CHEST WITH CONTRAST TECHNIQUE: Multidetector CT imaging of the chest was performed using the standard protocol during bolus administration of intravenous contrast. Multiplanar CT image reconstructions and MIPs were obtained to evaluate the vascular anatomy. RADIATION DOSE REDUCTION: This exam was performed according to the departmental dose-optimization program which includes automated exposure control, adjustment of the mA and/or kV according to patient size and/or use of iterative reconstruction technique. CONTRAST:  100mL OMNIPAQUE IOHEXOL 350 MG/ML SOLN COMPARISON:  CT chest dated March 01, 2021 FINDINGS: Cardiovascular: Decreased clot burden is seen in the left lower lobe  pulmonary artery and its branches when compared with July 30, 2021 prior exam. Normal heart size. No pericardial effusion. No coronary artery calcifications. No atherosclerotic disease of the thoracic aorta. Mediastinum/Nodes: Patulous esophagus. Thyroid is unremarkable. No pathologically enlarged lymph nodes seen in the chest. Lungs/Pleura: Increased consolidation of the left lower lobe with central ground-glass. New large loculated left pleural effusion with associated atelectasis. New Peribronchovascular ground-glass opacities are seen in the right upper lobe. Right basilar atelectasis. No pneumothorax. Upper Abdomen: No acute abnormality. Musculoskeletal: No chest wall abnormality. No acute or significant osseous findings. Review of the MIP images confirms the above findings. IMPRESSION: 1. Decreased clot burden is seen in the left lower lobe pulmonary artery and its branches when compared with July 30, 2021 prior exam. 2. Consolidation of the left lower lobe with central ground-glass, increased when compared with prior exam and likely due to evolving pulmonary infarct. 3. New large loculated left pleural effusion with associated atelectasis. 4. New peribronchovascular ground-glass opacities of the right upper lobe, likely infectious or inflammatory. Electronically Signed   By: Allegra LaiLeah  Strickland M.D.   On: 08/02/2021 09:43   ECHOCARDIOGRAM COMPLETE  Result Date: 07/31/2021    ECHOCARDIOGRAM REPORT   Patient Name:   Deshawna Borenstein Date of Exam: 07/31/2021 Medical Rec #:  [id number]  Height:       72.0 in Accession #:    [id number] Weight:       249.0 lb Date of Birth:  Jul 10, 1985 BSA:          2.339 m Patient Age:    35 years   BP:           143/102 mmHg Patient Gender: F          HR:           106 bpm. Exam Location:  Inpatient Procedure: 2D Echo, Cardiac Doppler, Color Doppler and Intracardiac            Opacification Agent Indications:    I26.02 Pulmonary embolus  History:        Patient has no prior history of  Echocardiogram examinations.                 Signs/Symptoms:Dyspnea and Shortness of Breath.  Sonographer:    Sheralyn Boatmanina West RDCS Referring Phys: [id number] CAROLE N HALL  Sonographer Comments: Technically difficult study due to poor echo windows, suboptimal parasternal window, suboptimal apical window, suboptimal subcostal window and patient is morbidly obese. Image acquisition challenging due to patient body habitus. Very difficult images. Could not visualize RV. IMPRESSIONS  1. Left ventricular ejection fraction, by estimation, is 55%. The left ventricle has normal function. The left ventricle has no regional wall motion abnormalities. Left  ventricular diastolic parameters were normal.  2. Right ventricular systolic function is normal. The right ventricular size is normal.  3. The pericardial effusion is posterior to the left ventricle.  4. The mitral valve is normal in structure. No evidence of mitral valve regurgitation. No evidence of mitral stenosis.  5. The aortic valve is tricuspid. Aortic valve regurgitation is not visualized. No aortic stenosis is present.  6. The inferior vena cava is normal in size with greater than 50% respiratory variability, suggesting right atrial pressure of 3 mmHg. FINDINGS  Left Ventricle: Left ventricular ejection fraction, by estimation, is 55%. The left ventricle has normal function. The left ventricle has no regional wall motion abnormalities. Definity contrast agent was given IV to delineate the left ventricular endocardial borders. The left ventricular internal cavity size was normal in size. There is no left ventricular hypertrophy. Left ventricular diastolic parameters were normal. Right Ventricle: The right ventricular size is normal. No increase in right ventricular wall thickness. Right ventricular systolic function is normal. Left Atrium: Left atrial size was normal in size. Right Atrium: Right atrial size was normal in size. Pericardium: Trivial pericardial effusion is  present. The pericardial effusion is posterior to the left ventricle. Mitral Valve: The mitral valve is normal in structure. No evidence of mitral valve regurgitation. No evidence of mitral valve stenosis. Tricuspid Valve: The tricuspid valve is normal in structure. Tricuspid valve regurgitation is trivial. No evidence of tricuspid stenosis. Aortic Valve: The aortic valve is tricuspid. Aortic valve regurgitation is not visualized. No aortic stenosis is present. Pulmonic Valve: The pulmonic valve was normal in structure. Pulmonic valve regurgitation is trivial. No evidence of pulmonic stenosis. Aorta: The aortic root is normal in size and structure. Venous: The inferior vena cava is normal in size with greater than 50% respiratory variability, suggesting right atrial pressure of 3 mmHg. IAS/Shunts: No atrial level shunt detected by color flow Doppler.  LEFT VENTRICLE PLAX 2D LVIDd:         5.00 cm     Diastology LVIDs:         4.20 cm     LV e' medial:    10.80 cm/s LV PW:         1.00 cm     LV E/e' medial:  6.7 LV IVS:        1.00 cm     LV e' lateral:   14.30 cm/s LVOT diam:     2.00 cm     LV E/e' lateral: 5.1 LV SV:         61 LV SV Index:   26 LVOT Area:     3.14 cm  LV Volumes (MOD) LV vol d, MOD A2C: 62.3 ml LV vol d, MOD A4C: 80.5 ml LV vol s, MOD A2C: 35.4 ml LV vol s, MOD A4C: 25.9 ml LV SV MOD A2C:     26.9 ml LV SV MOD A4C:     80.5 ml LV SV MOD BP:      38.4 ml RIGHT VENTRICLE             IVC RV S prime:     14.20 cm/s  IVC diam: 1.50 cm TAPSE (M-mode): 1.5 cm LEFT ATRIUM             Index       RIGHT ATRIUM          Index LA diam:        3.40 cm 1.45 cm/m  RA Area:  9.35 cm LA Vol (A2C):   14.7 ml 6.29 ml/m  RA Volume:   18.40 ml 7.87 ml/m LA Vol (A4C):   14.6 ml 6.24 ml/m LA Biplane Vol: 15.8 ml 6.76 ml/m  AORTIC VALVE LVOT Vmax:   138.00 cm/s LVOT Vmean:  90.700 cm/s LVOT VTI:    0.194 m  AORTA Ao Root diam: 3.10 cm Ao Asc diam:  3.00 cm MITRAL VALVE MV Area (PHT): 5.88 cm    SHUNTS MV  Decel Time: 129 msec    Systemic VTI:  0.19 m MV E velocity: 72.33 cm/s  Systemic Diam: 2.00 cm MV A velocity: 88.63 cm/s MV E/A ratio:  0.82 Charlton Haws MD Electronically signed by Charlton Haws MD Signature Date/Time: 07/31/2021/11:29:45 AM    Final     Scheduled Meds:  rosuvastatin  20 mg Oral QHS   Continuous Infusions:  heparin Stopped (08/02/21 0952)   lactated ringers 125 mL/hr at 08/02/21 0902     LOS: 3 days       Huey Bienenstock, MD [facility]   If 7PM-7AM, please contact night-coverage

## 2021-08-02 NOTE — Progress Notes (Addendum)
ANTICOAGULATION CONSULT NOTE - Follow Up Consult  Pharmacy Consult for Heparin infusion  Indication: pulmonary embolus and RLE DVT  Allergies  Allergen Reactions   Shellfish Allergy Hives    Itching     Patient Measurements: Height: 6' (182.9 cm) Weight: 115.8 kg (255 lb 4.7 oz) IBW/kg (Calculated) : 73.1 Heparin Dosing Weight: 100 kg  Vital Signs: Temp: 98 F (36.7 C) (06/18 0748) Temp Source: Oral (06/18 0748) BP: 121/78 (06/18 0834) Pulse Rate: 146 (06/18 0834)  Labs: Recent Labs    07/30/21 1510 07/30/21 1510 07/30/21 1749 07/30/21 1930 07/31/21 0345 07/31/21 1259 08/01/21 0408 08/01/21 1212 08/01/21 1839 08/02/21 0131 08/02/21 0804  HGB 14.6  --   --   --  12.9  --  12.4  --   --  12.5  --   HCT 41.2  --   --   --  35.6*  --  34.0*  --   --  35.0*  --   PLT 281  --   --   --  254  --  223  --   --  249  --   APTT  --   --   --   --  55*   < > 59* 78* 77* 65*  --   LABPROT  --   --   --  16.6*  --   --   --   --   --   --   --   INR  --   --   --  1.4*  --   --   --   --   --   --   --   HEPARINUNFRC  --    < >  --  0.85* 0.64  --  0.41 0.49  --  0.34  --   CREATININE 1.01*  --   --   --  0.97  --   --   --   --  0.87  --   TROPONINIHS 4  --  5  --   --   --   --   --   --   --  12   < > = values in this interval not displayed.     Estimated Creatinine Clearance: 129.7 mL/min (by C-G formula based on SCr of 0.87 mg/dL).   Medications:  Medications Prior to Admission  Medication Sig Dispense Refill Last Dose   NURTEC 75 MG TBDP Take 1 tablet by mouth daily as needed (pain).   Past Week   NUVARING 0.12-0.015 MG/24HR vaginal ring Place 1 each vaginally every 28 (twenty-eight) days.   07/30/2021 at ongoing   Rivaroxaban (XARELTO STARTER PACK PO) Take 15-20 mg by mouth as directed. Take 15 mg tablet BID for 21 Days. Then On Day 22 Switch to Take 20 mg tablet Daily   07/29/2021 at 1600   rosuvastatin (CRESTOR) 20 MG tablet Take 20 mg by mouth at bedtime.    07/29/2021    Assessment: 36 yo F presented to ED with SOB, L-sided chest pain worsening when taking deep breaths and nonproductive cough. Diagnosed with DVT recently by PCP and initiated on Xarelto. CT PE in ED positive for L-sided PE. No evidence of right heart strain. Pharmacy consulted for heparin infusion for PE/DVT.  Last dose of Xarelto was 6/14 @ 1600.   08/02/21:  aPTT =65 down slightly,  HL 0.34 on heparin 2350 units/hr. HL is therapeutic and appears to be correlating with PTT. Patient had just  started taking and took  only 1 dose of Xarelto on 6/14 @1600  , before presenting to ED.  Hgb stable 12s, pltc wnl stable. No bleeding noted.  Will now use heparin levels to monitor /adjust heparin.   Goal of Therapy:  aPTT 66-102 Heparin level 0.3-0.7 units/ml Monitor platelets by anticoagulation protocol: Yes   Plan:  Increase Heparin to 2450 units/hr  (notified RN on 87M) Check an 8 hr HL to confirm remains therapeutic for acute PE/ RLE DVT . Daily heparin level and CBC.    Thank you for allowing to participate in this patients care. 08/02/2021 10:19 AM  **Pharmacist phone directory can be found on [email or web address] listed under [facility]**

## 2021-08-02 NOTE — Progress Notes (Signed)
[facility]  Bedside ultrasound performed, complex collection of material noted in pleural space, no definitive pocket of fluid noted that would be amenable to percutaneous drainage.   Discussed with patient and thoracic surgery.  Thoracic surgery will see patient and consider VATS.  Heber Murdo, MD [facility] Pager: [phone] Cell: [phone] After 7:00 pm call Elink  [phone]

## 2021-08-02 NOTE — Progress Notes (Signed)
Patient transferred to ICU for higher level of care. Patient did not want me to call family for updates regarding transfer. All belongings with patient including cell phone and charger.

## 2021-08-02 NOTE — Consult Note (Signed)
NAMEMalvika Garcia, MRN:  [id number], DOB:  [birth year], LOS: 3 ADMISSION DATE:  07/30/2021, CONSULTATION DATE:  6/18 REFERRING MD:  Elgergawy, CHIEF COMPLAINT:  acute pulmonary emboli with persistent chest pain, dyspnea and tachycardia  History of Present Illness:  36 year old female with history as mentioned below. Initially went to her primary care provider's office with chief complaint of about 2-week history of worsening right lower extremity pain and swelling.  She is sent for a lower extremity ultrasound, found to have acute deep vein thrombosis, and Xarelto was initiated.  She is an active smoker, she is on birth control form of NuvaRing.  Endorses fairly sedentary lifestyle.  Presented to see the emergency room subsequently on 6/15 with chief complaint of fairly significant pleuritic type chest pain, back pain, and shortness of breath.  Was found to be tachycardic on arrival with heart rate in the 100s a CT of chest was obtained showing fairly extensive left-sided pulmonary emboli without evidence of right heart strain there was fairly significant left-sided airspace disease consistent with probable infarct she was started on IV heparin and admitted to the medical ward.  Over the time period from admission on 6/15 through 6/18 she has had persistent ongoing pleuritic type chest pain which has not improved, ongoing tachypnea with shallow respiratory efforts due to pain, and persistent tachycardia which is worsened over the last 48 hours, prior to critical care consult on 6/18 obtained for concern regarding possible worsening symptoms  Pertinent  Medical History  Anxiety, depression, high cholesterol, hand tremor, migraines. Tobacco abuse Significant Hospital Events: Including procedures, antibiotic start and stop dates in addition to other pertinent events   6/15 admitted with acute left-sided pulmonary emboli with what appears to be left-sided pulmonary infarct, outpatient Ultrasound from [facility]  showed right saphenofemoral junction DVT which extended into the common femoral vein.  Started on heparin 6/16 echocardiogram: LVEF 55% RV normal normal RV systolic function 6/18 pulmonary asked to evaluate for worsening tachycardia, ongoing pain, and tachypnea.  Stat CT showing decreased clot burden but new loculated large right pleural effusion worsening consolidation.  Moved to ICU, 2 L crystalloid challenge administered, lactate sent, IV vancomycin and cefepime initiated, heparin placed on hold chest tube placed  Interim History / Subjective:  Continues to report substernal/left-sided/back pain particularly with deep breath  Objective   Blood pressure 121/78, pulse (Abnormal) 146, temperature 98 F (36.7 C), temperature source Oral, resp. rate (Abnormal) 27, height 6' (1.829 m), weight 115.8 kg, last menstrual period 07/30/2021, SpO2 96 %.        Intake/Output Summary (Last 24 hours) at 08/02/2021 6378 Last data filed at 08/02/2021 0606 Gross per 24 hour  Intake 1185 ml  Output 800 ml  Net 385 ml   Filed Weights   07/30/21 1506 07/31/21 1350  Weight: 112.9 kg 115.8 kg    Examination: General: Anxious 36 year old female resting in bed remains tachypneic HENT: Normocephalic atraumatic mucous membranes are dry Lungs: Shallow respiratory efforts, rate in the mid 30s, diminished bases, endorses pleuritic discomfort Cardiovascular: Tachycardic without murmur rub or gallop heart rate currently 130s to 140s Abdomen: Soft not tender no organomegaly Extremities: Warm dry swelling has resolved Neuro: Awake oriented no focal deficits GU: Due to void  Resolved Hospital Problem list     Assessment & Plan:  Principal Problem:   Acute pulmonary embolism (HCC) Active Problems:   Pulmonary infarction (HCC)   DVT, lower extremity (HCC)   Pleural effusion on right   Sepsis (HCC)  Tachycardia   Chest pain   Anxiety   High cholesterol  Acute RLE DVT and Left Pulmonary emboli w/  associated pulmonary infarct -Provoking factors appear to be smoking, birth control, and sedentary lifestyle Plan Continuing IV heparin with eventual transition to DOAC after all procedures done and hemodynamically more stable  Complicated right pleural effusion, possible empyema Plan Hold IV heparin Place chest tube Send pleural fluid for analysis May need to consider pleural lytics  Sepsis secondary to pneumonia with probable empyema following pulmonary infarct Plan Chest tube as above IV Vanco and cefepime 2 L crystalloid challenge Check lactate Transfer to intensive care A.m. CBC  Chest pain secondary to complicated right pleural effusion Plan Low-dose IV Toradol  Hyperlipidemia Plan Continue Crestor  Best Practice (right click and "Reselect all SmartList Selections" daily)   Diet/type: Regular consistency (see orders) DVT prophylaxis: systemic heparin GI prophylaxis: PPI Lines: N/A Foley:  N/A Code Status:  full code Last date of multidisciplinary goals of care discussion [pending ]  Labs   CBC: Recent Labs  Lab 07/30/21 1510 07/31/21 0345 08/01/21 0408 08/02/21 0131  WBC 22.7* 23.6* 24.5* 22.7*  NEUTROABS  --  19.8*  --   --   HGB 14.6 12.9 12.4 12.5  HCT 41.2 35.6* 34.0* 35.0*  MCV 91.8 89.0 88.1 88.6  PLT 281 254 223 249    Basic Metabolic Panel: Recent Labs  Lab 07/30/21 1510 07/31/21 0345 08/02/21 0131  NA 136 132* 133*  K 3.9 3.9 3.7  CL 103 99 102  CO2 20* 20* 20*  GLUCOSE 125* 116* 120*  BUN 8 10 8   CREATININE 1.01* 0.97 0.87  CALCIUM 9.6 8.8* 8.4*  MG  --  1.4*  --   PHOS  --  3.3  --    GFR: Estimated Creatinine Clearance: 129.7 mL/min (by C-G formula based on SCr of 0.87 mg/dL). Recent Labs  Lab 07/30/21 1510 07/31/21 0345 08/01/21 0408 08/02/21 0131  PROCALCITON  --  0.31  --  0.70  WBC 22.7* 23.6* 24.5* 22.7*  LATICACIDVEN  --   --   --  0.7    Liver Function Tests: Recent Labs  Lab 07/30/21 2145 07/31/21 0345   AST 11* 12*  ALT 12 12  ALKPHOS 53 57  BILITOT 1.2 1.0  PROT 7.3 6.9  ALBUMIN 3.4* 3.1*   No results for input(s): "LIPASE", "AMYLASE" in the last 168 hours. No results for input(s): "AMMONIA" in the last 168 hours.  ABG No results found for: "PHART", "PCO2ART", "PO2ART", "HCO3", "TCO2", "ACIDBASEDEF", "O2SAT"   Coagulation Profile: Recent Labs  Lab 07/30/21 1930  INR 1.4*    Cardiac Enzymes: No results for input(s): "CKTOTAL", "CKMB", "CKMBINDEX", "TROPONINI" in the last 168 hours.  HbA1C: No results found for: "HGBA1C"  CBG: No results for input(s): "GLUCAP" in the last 168 hours.  Review of Systems:   Review of Systems  Constitutional:  Negative for chills, fever and malaise/fatigue.  HENT: Negative.    Eyes: Negative.   Respiratory:  Positive for shortness of breath.   Cardiovascular:  Positive for chest pain and leg swelling.  Gastrointestinal: Negative.   Genitourinary: Negative.   Musculoskeletal: Negative.   Skin: Negative.   Neurological: Negative.   Endo/Heme/Allergies: Negative.   Psychiatric/Behavioral: Negative.       Past Medical History:  She,  has a past medical history of Anxiety, Depression, DVT, lower extremity (HCC) (07/29/2021), High cholesterol, Migraine, and Tremor of both hands.   Surgical History:  Past Surgical History:  Procedure Laterality Date   CHOLECYSTECTOMY     TONSILLECTOMY     TUBAL LIGATION       Social History:   reports that she has been smoking cigarettes. She has been smoking an average of 1 pack per day. She has never used smokeless tobacco. She reports current alcohol use. She reports current drug use. Drug: Marijuana.   Family History:  Her family history includes Diabetes in her father and mother.   Allergies Allergies  Allergen Reactions   Shellfish Allergy Hives    Itching      Home Medications  Prior to Admission medications   Medication Sig Start Date End Date Taking? Authorizing Provider   NURTEC 75 MG TBDP Take 1 tablet by mouth daily as needed (pain). 04/29/21  Yes [provider]  NUVARING 0.12-0.015 MG/24HR vaginal ring Place 1 each vaginally every 28 (twenty-eight) days. 04/18/21  Yes [provider]  Rivaroxaban (XARELTO STARTER PACK PO) Take 15-20 mg by mouth as directed. Take 15 mg tablet BID for 21 Days. Then On Day 22 Switch to Take 20 mg tablet Daily   Yes [provider]  rosuvastatin (CRESTOR) 20 MG tablet Take 20 mg by mouth at bedtime. 07/20/21  Yes [provider]     Critical care time: 32 min     Erick Colace ACNP-BC [place] Pager # [phone] OR # [phone] if no answer

## 2021-08-02 NOTE — Progress Notes (Signed)
ANTICOAGULATION CONSULT NOTE - Follow Up Consult  Pharmacy Consult for Heparin infusion  Indication: pulmonary embolus and RLE DVT  Allergies  Allergen Reactions   Shellfish Allergy Hives    Itching     Patient Measurements: Height: 6' (182.9 cm) Weight: 115.8 kg (255 lb 4.7 oz) IBW/kg (Calculated) : 73.1 Heparin Dosing Weight: 100 kg  Vital Signs: Temp: 98 F (36.7 C) (06/18 0748) Temp Source: Oral (06/18 0748) BP: 135/81 (06/18 1100) Pulse Rate: 142 (06/18 1100)  Labs: Recent Labs    07/30/21 1510 07/30/21 1510 07/30/21 1749 07/30/21 1930 07/31/21 0345 07/31/21 1259 08/01/21 0408 08/01/21 1212 08/01/21 1839 08/02/21 0131 08/02/21 0804 08/02/21 1007  HGB 14.6  --   --   --  12.9  --  12.4  --   --  12.5  --   --   HCT 41.2  --   --   --  35.6*  --  34.0*  --   --  35.0*  --   --   PLT 281  --   --   --  254  --  223  --   --  249  --   --   APTT  --   --   --   --  55*   < > 59* 78* 77* 65*  --   --   LABPROT  --   --   --  16.6*  --   --   --   --   --   --   --   --   INR  --   --   --  1.4*  --   --   --   --   --   --   --   --   HEPARINUNFRC  --    < >  --  0.85* 0.64  --  0.41 0.49  --  0.34  --   --   CREATININE 1.01*  --   --   --  0.97  --   --   --   --  0.87  --   --   TROPONINIHS 4  --  5  --   --   --   --   --   --   --  12 10   < > = values in this interval not displayed.     Estimated Creatinine Clearance: 129.7 mL/min (by C-G formula based on SCr of 0.87 mg/dL).   Medications:  Medications Prior to Admission  Medication Sig Dispense Refill Last Dose   NURTEC 75 MG TBDP Take 1 tablet by mouth daily as needed (pain).   Past Week   NUVARING 0.12-0.015 MG/24HR vaginal ring Place 1 each vaginally every 28 (twenty-eight) days.   07/30/2021 at ongoing   Rivaroxaban (XARELTO STARTER PACK PO) Take 15-20 mg by mouth as directed. Take 15 mg tablet BID for 21 Days. Then On Day 22 Switch to Take 20 mg tablet Daily   07/29/2021 at 1600   rosuvastatin  (CRESTOR) 20 MG tablet Take 20 mg by mouth at bedtime.   07/29/2021    Assessment: 36 yo F presented to ED with SOB, L-sided chest pain worsening when taking deep breaths and nonproductive cough. Diagnosed with DVT recently by PCP and initiated on Xarelto. CT PE in ED positive for L-sided PE. No evidence of right heart strain. Pharmacy consulted for heparin infusion for PE/DVT.  Last dose of Xarelto was 6/14 @ 1600.  CVTS planning VATS for new large pleural effusion. Heparin infusion held this AM for procedure.  Goal of Therapy:  aPTT 66-102 Heparin level 0.3-0.7 units/ml Monitor platelets by anticoagulation protocol: Yes   Plan:  Confirmed plan with Dr. Maren Beach - he will hold heparin infusion until after patient's procedure tomorrow F/u anticoagulation plans post-op 6/19   Leia Alf, PharmD, BCPS Please check AMION for all [facility] contact numbers Clinical Pharmacist 08/02/2021 2:45 PM

## 2021-08-02 NOTE — Consult Note (Signed)
Tracy Garcia            [place] [address]          [phone]       Tracy Garcia Medical Record [id number] Date of Birth: [birth year]  No ref. provider found Dr. Percell Boston, NP  Chief Complaint:   Shortness of breath, coughing Chief Complaint  Patient presents with   Chest Pain  Patient examined, images of CT scans June 17 and June 18 personally reviewed, situation discussed with Dr. Lake Bells for coordination of care.  History of Present Illness:     36 year old obese female with smoking history recently diagnosed with DVT of the right leg and started on Xarelto.  She developed significant shortness of breath within about 24 hours and chest CT showed extensive pulmonary emboli limited to the left lower lobe.  She was admitted and placed on IV heparin and within 24 hours developed a large left hemothorax which was associated with a new nonproductive cough and persistent shortness of breath.  Her oxygen saturation is adequate on 4 L nasal cannula.  Her blood pressure is stable and she is in sinus tachycardia.  Echocardiogram performed recently shows no pericardial effusion with good biventricular function, no significant TR.  An attempt to drain the hemothorax with percutaneous thoracentesis was not successful due to the mainly solid nature of the collection.  Thoracic surgical evaluation was requested.   Current Activity/ Functional Status: Zubrod Score: At the time of surgery this patient's most appropriate activity status/level should be described as: []     0    Normal activity, no symptoms [x]     1    Restricted in physical strenuous activity but ambulatory, able to do out light work []     2    Ambulatory and capable of self care, unable to do work activities, up and about >50 % of waking hours                              []     3    Only limited self care, in bed greater than 50% of waking hours []     4    Completely  disabled, no self care, confined to bed or chair []     5    Moribund    Past Medical History:  Diagnosis Date   Anxiety    Depression    DVT, lower extremity (HCC) 07/29/2021   RIGHT   High cholesterol    Migraine    Tremor of both hands     Past Surgical History:  Procedure Laterality Date   CHOLECYSTECTOMY     TONSILLECTOMY     TUBAL LIGATION      Social History   Tobacco Use  Smoking Status Every Day   Packs/day: 1.00   Types: Cigarettes  Smokeless Tobacco Never    Social History   Substance and Sexual Activity  Alcohol Use Yes   Comment: occasionally    Social History   Socioeconomic History   Marital status: Significant Other    Spouse name: Not on file   Number of children: 2   Years of education: Not on file   Highest education level: GED or equivalent  Occupational History   Not on file  Tobacco Use   Smoking status: Every Day  Packs/day: 1.00    Types: Cigarettes   Smokeless tobacco: Never  Substance and Sexual Activity   Alcohol use: Yes    Comment: occasionally   Drug use: Yes    Types: Marijuana    Comment: not very often   Sexual activity: Yes    Birth control/protection: Surgical  Other Topics Concern   Not on file  Social History Narrative   Right handed    Caffeine 1-2 cups per day      Lives at [place]me with fiance, mother, and mothers boyfriend    Social Determinants of Health   Financial Resource Strain: Not on file  Food Insecurity: Not on file  Transportation Needs: Not on file  Physical Activity: Not on file  Stress: Not on file  Social Connections: Not on file  Intimate Partner Violence: Not on file    Allergies  Allergen Reactions   Shellfish Allergy Hives    Itching     Current Facility-Administered Medications  Medication Dose Route Frequency Provider Last Rate Last Admin   acetaminophen (TYLENOL) tablet 650 mg  650 mg Oral Q6H PRN Dow Adolph N, DO   650 mg at 07/31/21 1028   ALPRAZolam (XANAX) tablet 0.5  mg  0.5 mg Oral Q6H PRN Lupita Leash, MD       ceFEPIme (MAXIPIME) 2 g in sodium chloride 0.9 % 100 mL IVPB  2 g Intravenous Q8H Clark, Tonny Bollman, RPH       Chlorhexidine Gluconate Cloth 2 % PADS 6 each  6 each Topical Daily Max Fickle B, MD       HYDROmorphone (DILAUDID) injection 0.5 mg  0.5 mg Intravenous Q4H PRN Dow Adolph N, DO   0.5 mg at 08/02/21 1341   lactated ringers infusion   Intravenous Continuous Simonne Martinet, NP 125 mL/hr at 08/02/21 0902 Rate Change at 08/02/21 0902   melatonin tablet 5 mg  5 mg Oral QHS PRN Dow Adolph N, DO   5 mg at 07/30/21 2149   oxyCODONE (Oxy IR/ROXICODONE) immediate release tablet 5-10 mg  5-10 mg Oral Q6H PRN Lupita Leash, MD       polyethylene glycol (MIRALAX / GLYCOLAX) packet 17 g  17 g Oral Daily PRN Dow Adolph N, DO       prochlorperazine (COMPAZINE) injection 10 mg  10 mg Intravenous Q6H PRN Dow Adolph N, DO   10 mg at 07/31/21 2030   rosuvastatin (CRESTOR) tablet 20 mg  20 mg Oral QHS Hall, Carole N, DO   20 mg at 08/01/21 2119   [START ON 08/03/2021] vancomycin (VANCOREADY) IVPB 1250 mg/250 mL  1,250 mg Intravenous Q12H Tamera Reason, RPH         Family History  Problem Relation Age of Onset   Diabetes Mother    Diabetes Father      Review of Systems:     Cardiac Review of Systems: Y or N  Chest Pain [    ]  Resting SOB [ x  ] Exertional SOB  [ x ]  Orthopnea [  ]   Pedal Edema [   ]    Palpitations [  ] Syncope  [  ]   Presyncope [   ]  General Review of Systems: [Y] = yes [  ]=no Constitional: recent weight change [  ]; anorexia [  ]; fatigue [x  ]; nausea [  ]; night sweats [  ]; fever [  ]; or chills [  ];  Dental: poor dentition[  ]; Last Dentist visit: 1 year   Eye : blurred vision [  ]; diplopia [   ]; vision changes [  ];  Amaurosis fugax[  ]; Resp: cough [  ];   wheezing[  ];  hemoptysis[  ]; shortness of breath[  ]; paroxysmal nocturnal dyspnea[  ]; dyspnea on exertion[  ]; or orthopnea[  ];  GI:  gallstones[  ], vomiting[  ];  dysphagia[  ]; melena[  ];  hematochezia [  ]; heartburn[  ];   Hx of  Colonoscopy[  ]; GU: kidney stones [  ]; hematuria[  ];   dysuria [  ];  nocturia[  ];  history of     obstruction [  ];                 Skin: rash, swelling[  ];, hair loss[  ];  peripheral edema[  ];  or itching[  ]; Musculosketetal: myalgias[  ];  joint swelling[  ];  joint erythema[  ];  joint pain[  ];  back pain[  ];  Heme/Lymph: bruising[  ];  bleeding[  ];  anemia[  ];  Neuro: Norris[  ];  headaches[  ];  stroke[  ];  vertigo[  ];  seizures[  ];   paresthesias[  ];  difficulty walking[  ];  Psych:depression[ x ]; anxiety[  ];  Endocrine: diabetes[  ];  thyroid dysfunction[  ];  Immunizations: Flu [  ]; Pneumococcal[  ];  Other:  Physical Exam: BP 135/81   Pulse (!) 142   Temp 98.8 F (37.1 C) (Oral)   Resp (!) 40   Ht 6' (1.829 m)   Wt 115.8 kg   LMP 07/30/2021 (Exact Date)   SpO2 96%   BMI 34.62 kg/m      Physical Exam  General: Obese, anxious 36 year old female sitting up in her hospital bed with nasal cannula, resting dyspnea HEENT: Normocephalic pupils equal , dentition adequate Neck: Supple without JVD, adenopathy, or bruit Chest: Diminished breath sounds at left base, no rhonchi, no tenderness             or deformity Cardiovascular: Sinus tachycardia, no murmur, no gallop, peripheral pulses             palpable in all extremities Abdomen:  Soft, nontender, no palpable mass or organomegaly Extremities: Warm, well-perfused, no clubbing cyanosis edema or tenderness,              no venous stasis changes of the legs Rectal/GU: Deferred Neuro: Grossly non--focal and symmetrical throughout Skin: Clean and dry without rash or ulceration    Diagnostic Studies & Laboratory data:     Recent Radiology Findings:   ECHOCARDIOGRAM  LIMITED  Result Date: 08/02/2021    ECHOCARDIOGRAM REPORT   Patient Name:   Tracy Garcia Date of Exam: 08/02/2021 Medical Rec #:  [id number]  Height:       72.0 in Accession #:    [id number] Weight:       255.3 lb Date of Birth:  1985-06-27 BSA:          2.363 m Patient Age:    35 years   BP:           121/78 mmHg Patient Gender: F          HR:           140 bpm. Exam Location:  Inpatient Procedure: 2D Echo STAT ECHO Indications:    pulmonary  embolus. dyspnea.  History:        Patient has prior history of Echocardiogram examinations, most                 recent 07/31/2021. Sepsis.; Risk Factors:Dyslipidemia.  Sonographer:    Johny Chess RDCS Referring Phys: [id number] DAWOOD S ELGERGAWY  Sonographer Comments: Technically difficult study due to poor echo windows and Technically challenging study due to limited acoustic windows. Image acquisition challenging due to respiratory motion. IMPRESSIONS  1. Image quality is poor. Riht ventricle appears to be functioning well and is not dilated.  2. Left ventricular ejection fraction, by estimation, is 65 to 70%. The left ventricle has normal function. The left ventricle has no regional wall motion abnormalities. Left ventricular diastolic function could not be evaluated.  3. Right ventricular systolic function is normal. The right ventricular size is normal. There is normal pulmonary artery systolic pressure.  4. The mitral valve is normal in structure. No evidence of mitral valve regurgitation. No evidence of mitral stenosis.  5. The aortic valve was not well visualized. Aortic valve regurgitation is not visualized. No aortic stenosis is present.  6. The inferior vena cava is normal in size with greater than 50% respiratory variability, suggesting right atrial pressure of 3 mmHg. FINDINGS  Left Ventricle: Left ventricular ejection fraction, by estimation, is 65 to 70%. The left ventricle has normal function. The left ventricle has no regional wall motion abnormalities. The  left ventricular internal cavity size was normal in size. There is  no left ventricular hypertrophy. Left ventricular diastolic function could not be evaluated. Right Ventricle: The right ventricular size is normal. No increase in right ventricular wall thickness. Right ventricular systolic function is normal. There is normal pulmonary artery systolic pressure. The tricuspid regurgitant velocity is 1.44 m/s, and  with an assumed right atrial pressure of 3 mmHg, the estimated right ventricular systolic pressure is [id number] mmHg. Left Atrium: Left atrial size was normal in size. Right Atrium: Right atrial size was normal in size. Pericardium: There is no evidence of pericardial effusion. Mitral Valve: The mitral valve is normal in structure. No evidence of mitral valve regurgitation. No evidence of mitral valve stenosis. Tricuspid Valve: The tricuspid valve is normal in structure. Tricuspid valve regurgitation is not demonstrated. No evidence of tricuspid stenosis. Aortic Valve: The aortic valve was not well visualized. Aortic valve regurgitation is not visualized. No aortic stenosis is present. Pulmonic Valve: The pulmonic valve was normal in structure. Pulmonic valve regurgitation is not visualized. No evidence of pulmonic stenosis. Aorta: The aortic root is normal in size and structure. Venous: The inferior vena cava is normal in size with greater than 50% respiratory variability, suggesting right atrial pressure of 3 mmHg. IAS/Shunts: No atrial level shunt detected by color flow Doppler.  IVC IVC diam: 1.60 cm TRICUSPID VALVE TR Peak grad:   8.3 mmHg TR Vmax:        144.00 cm/s Skeet Latch MD Electronically signed by Skeet Latch MD Signature Date/Time: 08/02/2021/10:23:48 AM    Final    CT Angio Chest Pulmonary Embolism (PE) W or WO Contrast  Result Date: 08/02/2021 CLINICAL DATA:  Pulmonary embolus. EXAM: CT ANGIOGRAPHY CHEST WITH CONTRAST TECHNIQUE: Multidetector CT imaging of the chest was performed  using the standard protocol during bolus administration of intravenous contrast. Multiplanar CT image reconstructions and MIPs were obtained to evaluate the vascular anatomy. RADIATION DOSE REDUCTION: This exam was performed according to the departmental dose-optimization program which includes automated exposure control, adjustment  of the mA and/or kV according to patient size and/or use of iterative reconstruction technique. CONTRAST:  167mL OMNIPAQUE IOHEXOL 350 MG/ML SOLN COMPARISON:  CT chest dated March 01, 2021 FINDINGS: Cardiovascular: Decreased clot burden is seen in the left lower lobe pulmonary artery and its branches when compared with July 30, 2021 prior exam. Normal heart size. No pericardial effusion. No coronary artery calcifications. No atherosclerotic disease of the thoracic aorta. Mediastinum/Nodes: Patulous esophagus. Thyroid is unremarkable. No pathologically enlarged lymph nodes seen in the chest. Lungs/Pleura: Increased consolidation of the left lower lobe with central ground-glass. New large loculated left pleural effusion with associated atelectasis. New Peribronchovascular ground-glass opacities are seen in the right upper lobe. Right basilar atelectasis. No pneumothorax. Upper Abdomen: No acute abnormality. Musculoskeletal: No chest wall abnormality. No acute or significant osseous findings. Review of the MIP images confirms the above findings. IMPRESSION: 1. Decreased clot burden is seen in the left lower lobe pulmonary artery and its branches when compared with July 30, 2021 prior exam. 2. Consolidation of the left lower lobe with central ground-glass, increased when compared with prior exam and likely due to evolving pulmonary infarct. 3. New large loculated left pleural effusion with associated atelectasis. 4. New peribronchovascular ground-glass opacities of the right upper lobe, likely infectious or inflammatory. Electronically Signed   By: Yetta Glassman M.D.   On: 08/02/2021  09:43      Recent Lab Findings: Lab Results  Component Value Date   WBC 22.7 (H) 08/02/2021   HGB 12.5 08/02/2021   HCT 35.0 (L) 08/02/2021   PLT 249 08/02/2021   GLUCOSE 120 (H) 08/02/2021   ALT 12 07/31/2021   AST 12 (L) 07/31/2021   NA 133 (L) 08/02/2021   K 3.7 08/02/2021   CL 102 08/02/2021   CREATININE 0.87 08/02/2021   BUN 8 08/02/2021   CO2 20 (L) 08/02/2021   TSH 3.108 08/02/2021   INR 1.4 (H) 07/30/2021      Assessment / Plan:   36 year old female with extensive pulmonary emboli to the left lower lobe with pulmonary infarct and then pulmonary bleeding after initiation of IV heparin in addition to initial therapy with Xarelto. Attempt to drain the effusion with thoracentesis was unsuccessful due to the early organized intrapleural thrombus/hematoma.  The left hemothorax is currently not well tolerated with her history of previous smoking and now pulmonary embolus.  We will plan on proceeding with a left VATS and evacuation of hematoma /decortication of left lower lobe in a.m.  Details of procedure were discussed directly with the patient as well as her mother in the ICU.  They understand the plan for the operation, the need for postoperative ICU admission, the pain that is usually associated with thoracic surgical procedures, and the plan to leave large bore chest tubes in place postoperatively for a few days.  It was explained that the patient may need prolonged mechanical ventilation postoperatively.  Because of the significant bleeding which occurred in a very short period of time I would hold on heparin until surgery, then it can be resumed at low-dose and titrated upwards following chest tube output and hematocrit.  The patient understands that pulmonary resection is not planned or expected.

## 2021-08-02 NOTE — Progress Notes (Signed)
   08/02/21 0718  Assess: MEWS Score  BP (!) 128/99  MAP (mmHg) 105  Pulse Rate (!) 145  ECG Heart Rate (!) 145  Resp (!) 34  SpO2 95 %  Assess: MEWS Score  MEWS Temp 0  MEWS Systolic 0  MEWS Pulse 3  MEWS RR 2  MEWS LOC 0  MEWS Score 5  MEWS Score Color Red  Assess: if the MEWS score is Yellow or Red  Were vital signs taken at a resting state? Yes  Focused Assessment No change from prior assessment  Does the patient meet 2 or more of the SIRS criteria? No  Does the patient have a confirmed or suspected source of infection? No  MEWS guidelines implemented *See Row Information* No, previously red, continue vital signs every 4 hours  Assess: SIRS CRITERIA  SIRS Temperature  0  SIRS Pulse 1  SIRS Respirations  1  SIRS WBC 1  SIRS Score Sum  3   Patient with tachycardia, condition unchanged from previous shift. Noted new orders for labs and EKG. MD and rapid response updated.

## 2021-08-02 NOTE — Progress Notes (Signed)
Pharmacy Antibiotic Note  Tracy Garcia is a 36 y.o. female admitted on 07/30/2021 with chest pain and shortness of breath, diagnosed with acute PE.  Recently diagnosed with RLE DVT on 6/14 day before admission.   Today 08/02/21, pharmacy has been consulted for vancomycin and Cefepime dosing for pneumonia/ HCAP.  Plan: Cefepime 2 g IV q8h Vancomycin 2500 mg IV x1 load then 1250 mg IV  Q12 hrs. Goal AUC 400-550.Expected AUC: 471 SCr used 0.87, Vd 0.5 L/kg d/t BMI >30. Monitor clinical status, renal function and culture results daily.  Check vanc levels per protocol  Height: 6' (182.9 cm) Weight: 115.8 kg (255 lb 4.7 oz) IBW/kg (Calculated) : 73.1  Temp (24hrs), Avg:98.5 F (36.9 C), Min:98 F (36.7 C), Max:99 F (37.2 C)  Recent Labs  Lab 07/30/21 1510 07/31/21 0345 08/01/21 0408 08/02/21 0131  WBC 22.7* 23.6* 24.5* 22.7*  CREATININE 1.01* 0.97  --  0.87  LATICACIDVEN  --   --   --  0.7    Estimated Creatinine Clearance: 129.7 mL/min (by C-G formula based on SCr of 0.87 mg/dL).    Allergies  Allergen Reactions   Shellfish Allergy Hives    Itching     Antimicrobials this admission:  Vancomycin  6/18>> Cefepime 6/18>>>   Dose adjustments this admission: N/a  Microbiology results:  none  Thank you for allowing pharmacy to be a part of this patient's care.  Noah Delaine, RPh Clinical Pharmacist [phone] 08/02/2021 10:40 AM Please check AMION for all [facility] Pharmacy phone numbers After 10:00 PM, call Main Pharmacy [phone]

## 2021-08-02 NOTE — Progress Notes (Signed)
eLink Physician-Brief Progress Note Patient Name: Tracy Garcia DOB: [birth year] MRN: [id number]   Date of Service  08/02/2021  HPI/Events of Note  PT requesting for cough medication.   35/F with extensive PE on heparin gtt and developed hemothorax with unsuccessful attempt at percutaneous drainage given organized intrapleural thrombus.  Plan in place for left VATS and evacuation of hematoma in the morning. Pt has severe pain with coughing.   eICU Interventions  Trial of tessalon perles.     Intervention Category Minor Interventions: Other:  Larinda Buttery 08/02/2021, 8:20 PM

## 2021-08-02 NOTE — Progress Notes (Signed)
  Echocardiogram 2D Echocardiogram has been performed.  Delcie Roch 08/02/2021, 10:16 AM

## 2021-08-03 ENCOUNTER — Inpatient Hospital Stay (HOSPITAL_COMMUNITY): Payer: 59

## 2021-08-03 ENCOUNTER — Encounter (HOSPITAL_COMMUNITY): Payer: Self-pay | Admitting: Internal Medicine

## 2021-08-03 ENCOUNTER — Inpatient Hospital Stay (HOSPITAL_COMMUNITY): Payer: 59 | Admitting: Registered Nurse

## 2021-08-03 ENCOUNTER — Encounter (HOSPITAL_COMMUNITY)
Admission: EM | Disposition: A | Discharge status: Home / Self Care | Payer: Self-pay | Source: Home / Self Care | Attending: Internal Medicine

## 2021-08-03 ENCOUNTER — Other Ambulatory Visit: Payer: Self-pay

## 2021-08-03 DIAGNOSIS — R0603 Acute respiratory distress: Secondary | ICD-10-CM

## 2021-08-03 DIAGNOSIS — J90 Pleural effusion, not elsewhere classified: Secondary | ICD-10-CM

## 2021-08-03 DIAGNOSIS — I2699 Other pulmonary embolism without acute cor pulmonale: Secondary | ICD-10-CM

## 2021-08-03 DIAGNOSIS — Z86718 Personal history of other venous thrombosis and embolism: Secondary | ICD-10-CM

## 2021-08-03 DIAGNOSIS — E669 Obesity, unspecified: Secondary | ICD-10-CM

## 2021-08-03 DIAGNOSIS — J942 Hemothorax: Secondary | ICD-10-CM

## 2021-08-03 DIAGNOSIS — Z86711 Personal history of pulmonary embolism: Secondary | ICD-10-CM

## 2021-08-03 DIAGNOSIS — J984 Other disorders of lung: Secondary | ICD-10-CM

## 2021-08-03 HISTORY — PX: PAIN PUMP IMPLANTATION: SHX330

## 2021-08-03 HISTORY — PX: THORACOTOMY: SHX5074

## 2021-08-03 HISTORY — PX: VIDEO ASSISTED THORACOSCOPY (VATS)/EMPYEMA: SHX6172

## 2021-08-03 LAB — BASIC METABOLIC PANEL
Anion gap: 8 (ref 5–15)
BUN: 7 mg/dL (ref 6–20)
CO2: 23 mmol/L (ref 22–32)
Calcium: 8.1 mg/dL — ABNORMAL LOW (ref 8.9–10.3)
Chloride: 103 mmol/L (ref 98–111)
Creatinine, Ser: 0.85 mg/dL (ref 0.44–1.00)
GFR, Estimated: >60 mL/min (ref >60)
Glucose, Bld: 120 mg/dL — ABNORMAL HIGH (ref 70–99)
Potassium: 3.9 mmol/L (ref 3.5–5.1)
Sodium: 134 mmol/L — ABNORMAL LOW (ref 135–145)

## 2021-08-03 LAB — POCT I-STAT 7, (LYTES, BLD GAS, ICA,H+H)
Acid-base deficit: 6 mmol/L — ABNORMAL HIGH (ref 0.0–2.0)
Acid-base deficit: 6 mmol/L — ABNORMAL HIGH (ref 0.0–2.0)
Acid-base deficit: 5 mmol/L — ABNORMAL HIGH (ref 0.0–2.0)
Bicarbonate: 22.5 mmol/L (ref 20.0–28.0)
Bicarbonate: 19.9 mmol/L — ABNORMAL LOW (ref 20.0–28.0)
Bicarbonate: 20.5 mmol/L (ref 20.0–28.0)
Calcium, Ion: 1.12 mmol/L — ABNORMAL LOW (ref 1.15–1.40)
Calcium, Ion: 1.1 mmol/L — ABNORMAL LOW (ref 1.15–1.40)
Calcium, Ion: 1.07 mmol/L — ABNORMAL LOW (ref 1.15–1.40)
HCT: 30 % — ABNORMAL LOW (ref 36.0–46.0)
HCT: 31 % — ABNORMAL LOW (ref 36.0–46.0)
HCT: 29 % — ABNORMAL LOW (ref 36.0–46.0)
Hemoglobin: 10.2 g/dL — ABNORMAL LOW (ref 12.0–15.0)
Hemoglobin: 10.5 g/dL — ABNORMAL LOW (ref 12.0–15.0)
Hemoglobin: 9.9 g/dL — ABNORMAL LOW (ref 12.0–15.0)
O2 Saturation: 99 %
O2 Saturation: 95 %
O2 Saturation: 100 %
Patient temperature: 97.1
Patient temperature: 97.5
Potassium: 3.8 mmol/L (ref 3.5–5.1)
Potassium: 4.1 mmol/L (ref 3.5–5.1)
Potassium: 4.3 mmol/L (ref 3.5–5.1)
Sodium: 134 mmol/L — ABNORMAL LOW (ref 135–145)
Sodium: 134 mmol/L — ABNORMAL LOW (ref 135–145)
Sodium: 134 mmol/L — ABNORMAL LOW (ref 135–145)
TCO2: 24 mmol/L (ref 22–32)
TCO2: 21 mmol/L — ABNORMAL LOW (ref 22–32)
TCO2: 22 mmol/L (ref 22–32)
pCO2 arterial: 56.9 mmHg — ABNORMAL HIGH (ref 32–48)
pCO2 arterial: 39.9 mmHg (ref 32–48)
pCO2 arterial: 37 mmHg (ref 32–48)
pH, Arterial: 7.205 — ABNORMAL LOW (ref 7.35–7.45)
pH, Arterial: 7.301 — ABNORMAL LOW (ref 7.35–7.45)
pH, Arterial: 7.35 (ref 7.35–7.45)
pO2, Arterial: 156 mmHg — ABNORMAL HIGH (ref 83–108)
pO2, Arterial: 83 mmHg (ref 83–108)
pO2, Arterial: 195 mmHg — ABNORMAL HIGH (ref 83–108)

## 2021-08-03 LAB — CBC
HCT: 33 % — ABNORMAL LOW (ref 36.0–46.0)
Hemoglobin: 11.9 g/dL — ABNORMAL LOW (ref 12.0–15.0)
MCH: 32.3 pg (ref 26.0–34.0)
MCHC: 36.1 g/dL — ABNORMAL HIGH (ref 30.0–36.0)
MCV: 89.7 fL (ref 80.0–100.0)
Platelets: 247 10*3/uL (ref 150–400)
RBC: 3.68 MIL/uL — ABNORMAL LOW (ref 3.87–5.11)
RDW: 12.2 % (ref 11.5–15.5)
WBC: 18.8 10*3/uL — ABNORMAL HIGH (ref 4.0–10.5)
nRBC: 0 % (ref 0.0–0.2)

## 2021-08-03 LAB — GLUCOSE, CAPILLARY
Glucose-Capillary: 144 mg/dL — ABNORMAL HIGH (ref 70–99)
Glucose-Capillary: 141 mg/dL — ABNORMAL HIGH (ref 70–99)

## 2021-08-03 LAB — SURGICAL PCR SCREEN
MRSA, PCR: NEGATIVE
Staphylococcus aureus: NEGATIVE

## 2021-08-03 LAB — HEMOGLOBIN AND HEMATOCRIT, BLOOD
HCT: 28.6 % — ABNORMAL LOW (ref 36.0–46.0)
Hemoglobin: 10.5 g/dL — ABNORMAL LOW (ref 12.0–15.0)

## 2021-08-03 LAB — PROCALCITONIN: Procalcitonin: 0.54 ng/mL

## 2021-08-03 LAB — HEPARIN LEVEL (UNFRACTIONATED): Heparin Unfractionated: <0.1 IU/mL — ABNORMAL LOW (ref 0.30–0.70)

## 2021-08-03 LAB — LACTIC ACID, PLASMA: Lactic Acid, Venous: 0.8 mmol/L (ref 0.5–1.9)

## 2021-08-03 SURGERY — VIDEO ASSISTED THORACOSCOPY (VATS)/EMPYEMA
Anesthesia: General | Site: Chest | Laterality: Left

## 2021-08-03 MED ORDER — FENTANYL CITRATE (PF) 250 MCG/5ML IJ SOLN
INTRAMUSCULAR | Status: AC
  Filled 2021-08-03: qty 5

## 2021-08-03 MED ORDER — 0.9 % SODIUM CHLORIDE (POUR BTL) OPTIME
TOPICAL | Status: DC | PRN
  Administered 2021-08-03: 1000 mL

## 2021-08-03 MED ORDER — BENZONATATE 100 MG PO CAPS: 100.0000 mg | ORAL_CAPSULE | Freq: Three times a day (TID) | ORAL | Status: DC | PRN

## 2021-08-03 MED ORDER — MIDAZOLAM HCL 2 MG/2ML IJ SOLN
INTRAMUSCULAR | Status: AC
  Filled 2021-08-03: qty 2

## 2021-08-03 MED ORDER — POLYETHYLENE GLYCOL 3350 17 G PO PACK: 17.0000 g | PACK | Freq: Every day | ORAL | Status: DC | PRN

## 2021-08-03 MED ORDER — ROCURONIUM BROMIDE 10 MG/ML (PF) SYRINGE
PREFILLED_SYRINGE | INTRAVENOUS | Status: AC
  Filled 2021-08-03: qty 10

## 2021-08-03 MED ORDER — FENTANYL CITRATE PF 50 MCG/ML IJ SOSY: 50.0000 ug | PREFILLED_SYRINGE | Freq: Once | INTRAMUSCULAR | Status: DC

## 2021-08-03 MED ORDER — SENNOSIDES-DOCUSATE SODIUM 8.6-50 MG PO TABS
1.0000 | ORAL_TABLET | Freq: Every day | ORAL | Status: DC
  Administered 2021-08-03: 1
  Filled 2021-08-03: qty 1

## 2021-08-03 MED ORDER — PROPOFOL 10 MG/ML IV BOLUS
INTRAVENOUS | Status: DC | PRN
  Administered 2021-08-03: 200 mg via INTRAVENOUS

## 2021-08-03 MED ORDER — OXYCODONE HCL 5 MG PO TABS: 5.0000 mg | ORAL_TABLET | ORAL | Status: DC | PRN

## 2021-08-03 MED ORDER — FENTANYL 2500MCG IN NS 250ML (10MCG/ML) PREMIX INFUSION
0.0000 ug/h | INTRAVENOUS | Status: DC
  Administered 2021-08-03: 100 ug/h via INTRAVENOUS

## 2021-08-03 MED ORDER — PROPOFOL 1000 MG/100ML IV EMUL
INTRAVENOUS | Status: AC
Start: 2021-08-03 — End: ?
  Filled 2021-08-03: qty 100

## 2021-08-03 MED ORDER — DEXAMETHASONE SODIUM PHOSPHATE 10 MG/ML IJ SOLN
INTRAMUSCULAR | Status: DC | PRN
  Administered 2021-08-03: 10 mg via INTRAVENOUS

## 2021-08-03 MED ORDER — ACETAMINOPHEN 500 MG PO TABS: 1000.0000 mg | ORAL_TABLET | Freq: Four times a day (QID) | ORAL | Status: DC

## 2021-08-03 MED ORDER — LEVALBUTEROL HCL 1.25 MG/0.5ML IN NEBU
1.2500 mg | INHALATION_SOLUTION | Freq: Four times a day (QID) | RESPIRATORY_TRACT | Status: DC
  Administered 2021-08-04 – 2021-08-08 (×17): 1.25 mg via RESPIRATORY_TRACT
  Filled 2021-08-03 (×17): qty 0.5

## 2021-08-03 MED ORDER — ROSUVASTATIN CALCIUM 20 MG PO TABS
20.0000 mg | ORAL_TABLET | Freq: Every day | ORAL | Status: DC
  Administered 2021-08-03: 20 mg
  Filled 2021-08-03: qty 1

## 2021-08-03 MED ORDER — MIDAZOLAM HCL 2 MG/2ML IJ SOLN
INTRAMUSCULAR | Status: DC | PRN
  Administered 2021-08-03 (×2): 2 mg via INTRAVENOUS

## 2021-08-03 MED ORDER — INSULIN ASPART 100 UNIT/ML IJ SOLN: 0.0000 [IU] | Freq: Four times a day (QID) | INTRAMUSCULAR | Status: DC

## 2021-08-03 MED ORDER — ALPRAZOLAM 0.5 MG PO TABS: 0.5000 mg | ORAL_TABLET | Freq: Four times a day (QID) | ORAL | Status: DC | PRN

## 2021-08-03 MED ORDER — FENTANYL CITRATE (PF) 250 MCG/5ML IJ SOLN
INTRAMUSCULAR | Status: DC | PRN
  Administered 2021-08-03 (×2): 50 ug via INTRAVENOUS
  Administered 2021-08-03: 150 ug via INTRAVENOUS
  Administered 2021-08-03: 100 ug via INTRAVENOUS
  Administered 2021-08-03 (×2): 50 ug via INTRAVENOUS

## 2021-08-03 MED ORDER — SENNOSIDES-DOCUSATE SODIUM 8.6-50 MG PO TABS: 1.0000 | ORAL_TABLET | Freq: Every day | ORAL | Status: DC

## 2021-08-03 MED ORDER — ORAL CARE MOUTH RINSE
15.0000 mL | OROMUCOSAL | Status: DC
Start: 2021-08-03 — End: 2021-08-04
  Administered 2021-08-03 – 2021-08-04 (×7): 15 mL via OROMUCOSAL

## 2021-08-03 MED ORDER — DEXAMETHASONE SODIUM PHOSPHATE 10 MG/ML IJ SOLN
INTRAMUSCULAR | Status: AC
  Filled 2021-08-03: qty 1

## 2021-08-03 MED ORDER — PANTOPRAZOLE 2 MG/ML SUSPENSION
40.0000 mg | Freq: Every day | ORAL | Status: DC
Start: 2021-08-03 — End: 2021-08-04
  Administered 2021-08-03: 40 mg
  Filled 2021-08-03: qty 20

## 2021-08-03 MED ORDER — ONDANSETRON HCL 4 MG/2ML IJ SOLN
INTRAMUSCULAR | Status: AC
  Filled 2021-08-03: qty 2

## 2021-08-03 MED ORDER — CHLORHEXIDINE GLUCONATE 0.12 % MT SOLN
OROMUCOSAL | Status: AC
  Administered 2021-08-03: 15 mL via OROMUCOSAL
  Filled 2021-08-03: qty 15

## 2021-08-03 MED ORDER — SUCCINYLCHOLINE CHLORIDE 200 MG/10ML IV SOSY
PREFILLED_SYRINGE | INTRAVENOUS | Status: DC | PRN
  Administered 2021-08-03: 100 mg via INTRAVENOUS

## 2021-08-03 MED ORDER — ORAL CARE MOUTH RINSE
15.0000 mL | OROMUCOSAL | Status: DC | PRN
  Administered 2021-08-03: 15 mL via OROMUCOSAL

## 2021-08-03 MED ORDER — ACETAMINOPHEN 160 MG/5ML PO SOLN: 1000.0000 mg | Freq: Four times a day (QID) | ORAL | Status: DC

## 2021-08-03 MED ORDER — MIDAZOLAM HCL 2 MG/2ML IJ SOLN
2.0000 mg | INTRAMUSCULAR | Status: DC | PRN
  Administered 2021-08-03 (×4): 2 mg via INTRAVENOUS
  Filled 2021-08-03 (×5): qty 2

## 2021-08-03 MED ORDER — SUCCINYLCHOLINE CHLORIDE 200 MG/10ML IV SOSY
PREFILLED_SYRINGE | INTRAVENOUS | Status: AC
  Filled 2021-08-03: qty 10

## 2021-08-03 MED ORDER — ONDANSETRON HCL 4 MG/2ML IJ SOLN
INTRAMUSCULAR | Status: DC | PRN
  Administered 2021-08-03: 4 mg via INTRAVENOUS

## 2021-08-03 MED ORDER — LACTATED RINGERS IV SOLN: INTRAVENOUS | Status: DC

## 2021-08-03 MED ORDER — FENTANYL CITRATE PF 50 MCG/ML IJ SOSY
50.0000 ug | PREFILLED_SYRINGE | INTRAMUSCULAR | Status: DC | PRN
  Administered 2021-08-03 (×2): 50 ug via INTRAVENOUS
  Filled 2021-08-03 (×2): qty 1

## 2021-08-03 MED ORDER — ROSUVASTATIN CALCIUM 20 MG PO TABS: 20.0000 mg | ORAL_TABLET | Freq: Every day | ORAL | Status: DC

## 2021-08-03 MED ORDER — MIDAZOLAM HCL 2 MG/2ML IJ SOLN
INTRAMUSCULAR | Status: AC
Start: 2021-08-03 — End: ?
  Filled 2021-08-03: qty 2

## 2021-08-03 MED ORDER — BUPIVACAINE 0.5 % ON-Q PUMP SINGLE CATH 300 ML
INJECTION | Status: AC | PRN
  Administered 2021-08-03: 300 mL

## 2021-08-03 MED ORDER — ACETAMINOPHEN 160 MG/5ML PO SOLN
1000.0000 mg | Freq: Four times a day (QID) | ORAL | Status: DC
  Administered 2021-08-03 – 2021-08-04 (×2): 1000 mg
  Filled 2021-08-03 (×2): qty 40.6

## 2021-08-03 MED ORDER — FENTANYL BOLUS VIA INFUSION: 50.0000 ug | INTRAVENOUS | Status: DC | PRN

## 2021-08-03 MED ORDER — POTASSIUM CHLORIDE IN NACL 20-0.9 MEQ/L-% IV SOLN
INTRAVENOUS | Status: DC
  Filled 2021-08-03: qty 1000

## 2021-08-03 MED ORDER — MELATONIN 5 MG PO TABS
5.0000 mg | ORAL_TABLET | Freq: Every evening | ORAL | Status: DC | PRN
  Filled 2021-08-03: qty 1

## 2021-08-03 MED ORDER — OXYCODONE HCL 5 MG PO TABS
5.0000 mg | ORAL_TABLET | ORAL | Status: DC | PRN
  Administered 2021-08-03: 10 mg
  Filled 2021-08-03: qty 2

## 2021-08-03 MED ORDER — ALPRAZOLAM 0.5 MG PO TABS
0.5000 mg | ORAL_TABLET | Freq: Four times a day (QID) | ORAL | Status: DC | PRN
  Administered 2021-08-03: 0.5 mg
  Filled 2021-08-03: qty 1

## 2021-08-03 MED ORDER — MELATONIN 5 MG PO TABS: 5.0000 mg | ORAL_TABLET | Freq: Every evening | ORAL | Status: DC | PRN

## 2021-08-03 MED ORDER — DEXMEDETOMIDINE HCL IN NACL 400 MCG/100ML IV SOLN: 0.0000 ug/kg/h | INTRAVENOUS | Status: DC

## 2021-08-03 MED ORDER — INSULIN ASPART 100 UNIT/ML IJ SOLN
0.0000 [IU] | INTRAMUSCULAR | Status: DC
  Administered 2021-08-03 (×2): 1 [IU] via SUBCUTANEOUS
  Administered 2021-08-04: 3 [IU] via SUBCUTANEOUS
  Administered 2021-08-04: 2 [IU] via SUBCUTANEOUS

## 2021-08-03 MED ORDER — ONDANSETRON HCL 4 MG/2ML IJ SOLN
4.0000 mg | Freq: Four times a day (QID) | INTRAMUSCULAR | Status: DC | PRN
  Administered 2021-08-03: 4 mg via INTRAVENOUS
  Filled 2021-08-03: qty 2

## 2021-08-03 MED ORDER — BENZONATATE 100 MG PO CAPS
100.0000 mg | ORAL_CAPSULE | Freq: Three times a day (TID) | ORAL | Status: DC | PRN
Start: 2021-08-03 — End: 2021-08-10
  Administered 2021-08-09 – 2021-08-10 (×3): 100 mg via ORAL
  Filled 2021-08-03 (×3): qty 1

## 2021-08-03 MED ORDER — MIDAZOLAM HCL 2 MG/2ML IJ SOLN: 2.0000 mg | INTRAMUSCULAR | Status: DC | PRN

## 2021-08-03 MED ORDER — LIDOCAINE 2% (20 MG/ML) 5 ML SYRINGE
INTRAMUSCULAR | Status: DC | PRN
  Administered 2021-08-03: 60 mg via INTRAVENOUS

## 2021-08-03 MED ORDER — FENTANYL CITRATE PF 50 MCG/ML IJ SOSY: 50.0000 ug | PREFILLED_SYRINGE | INTRAMUSCULAR | Status: DC | PRN

## 2021-08-03 MED ORDER — BISACODYL 5 MG PO TBEC
10.0000 mg | DELAYED_RELEASE_TABLET | Freq: Every day | ORAL | Status: DC
  Administered 2021-08-04: 10 mg via ORAL
  Filled 2021-08-03: qty 2

## 2021-08-03 MED ORDER — ROCURONIUM BROMIDE 10 MG/ML (PF) SYRINGE
PREFILLED_SYRINGE | INTRAVENOUS | Status: DC | PRN
  Administered 2021-08-03 (×5): 50 mg via INTRAVENOUS

## 2021-08-03 MED ORDER — DEXMEDETOMIDINE HCL IN NACL 400 MCG/100ML IV SOLN
0.0000 ug/kg/h | INTRAVENOUS | Status: DC
  Administered 2021-08-03 (×2): 1.6 ug/kg/h via INTRAVENOUS
  Administered 2021-08-03: 1.2 ug/kg/h via INTRAVENOUS
  Administered 2021-08-03 – 2021-08-04 (×6): 1.6 ug/kg/h via INTRAVENOUS
  Filled 2021-08-03 (×9): qty 100

## 2021-08-03 MED ORDER — FENTANYL 2500MCG IN NS 250ML (10MCG/ML) PREMIX INFUSION
50.0000 ug/h | INTRAVENOUS | Status: DC
  Filled 2021-08-03: qty 250

## 2021-08-03 MED ORDER — ORAL CARE MOUTH RINSE: 15.0000 mL | Freq: Once | OROMUCOSAL | Status: AC

## 2021-08-03 MED ORDER — POLYETHYLENE GLYCOL 3350 17 G PO PACK
17.0000 g | PACK | Freq: Every day | ORAL | Status: DC
Start: 2021-08-03 — End: 2021-08-04
  Administered 2021-08-03: 17 g
  Filled 2021-08-03: qty 1

## 2021-08-03 MED ORDER — FENTANYL CITRATE PF 50 MCG/ML IJ SOSY
50.0000 ug | PREFILLED_SYRINGE | INTRAMUSCULAR | Status: DC | PRN
  Administered 2021-08-03: 100 ug via INTRAVENOUS
  Filled 2021-08-03: qty 2

## 2021-08-03 MED ORDER — CHLORHEXIDINE GLUCONATE 0.12 % MT SOLN: 15.0000 mL | Freq: Once | OROMUCOSAL | Status: AC

## 2021-08-03 MED ORDER — IPRATROPIUM-ALBUTEROL 0.5-2.5 (3) MG/3ML IN SOLN
3.0000 mL | Freq: Four times a day (QID) | RESPIRATORY_TRACT | Status: DC
  Administered 2021-08-03 (×2): 3 mL via RESPIRATORY_TRACT
  Filled 2021-08-03 (×2): qty 3

## 2021-08-03 MED ORDER — HEPARIN (PORCINE) 25000 UT/250ML-% IV SOLN
2200.0000 [IU]/h | INTRAVENOUS | Status: DC
  Administered 2021-08-03: 1000 [IU]/h via INTRAVENOUS
  Administered 2021-08-04: 2200 [IU]/h via INTRAVENOUS
  Administered 2021-08-04: 1800 [IU]/h via INTRAVENOUS
  Administered 2021-08-05 (×2): 2200 [IU]/h via INTRAVENOUS
  Filled 2021-08-03 (×5): qty 250

## 2021-08-03 MED ORDER — DOCUSATE SODIUM 50 MG/5ML PO LIQD
100.0000 mg | Freq: Two times a day (BID) | ORAL | Status: DC
Start: 2021-08-03 — End: 2021-08-04
  Administered 2021-08-03 (×2): 100 mg
  Filled 2021-08-03 (×2): qty 10

## 2021-08-03 MED ORDER — PROPOFOL 500 MG/50ML IV EMUL
INTRAVENOUS | Status: DC | PRN
  Administered 2021-08-03: 50 ug/kg/min via INTRAVENOUS

## 2021-08-03 MED ORDER — PROPOFOL 10 MG/ML IV BOLUS
INTRAVENOUS | Status: AC
  Filled 2021-08-03: qty 20

## 2021-08-03 MED ORDER — BUPIVACAINE 0.5 % ON-Q PUMP SINGLE CATH 300 ML
300.0000 mL | INJECTION | Status: DC
  Filled 2021-08-03: qty 300

## 2021-08-03 MED ORDER — HYDROMORPHONE HCL 1 MG/ML IJ SOLN
0.5000 mg | INTRAMUSCULAR | Status: DC | PRN
  Administered 2021-08-03: 0.5 mg via INTRAVENOUS
  Filled 2021-08-03: qty 0.5

## 2021-08-03 SURGICAL SUPPLY — 46 items
ADH SKN CLS APL DERMABOND .7 (GAUZE/BANDAGES/DRESSINGS) ×1
BLADE CLIPPER SURG (BLADE) ×2 IMPLANT
BLADE SURG 11 STRL SS (BLADE) ×2 IMPLANT
CANISTER SUCT 3000ML PPV (MISCELLANEOUS) ×2 IMPLANT
CATH KIT ON-Q SILVERSOAK 5 (CATHETERS) IMPLANT
CATH KIT ON-Q SILVERSOAK 5IN (CATHETERS) ×2 IMPLANT
CATH THORACIC 28FR (CATHETERS) ×1 IMPLANT
CNTNR URN SCR LID CUP LEK RST (MISCELLANEOUS) ×2 IMPLANT
CONN ST 1/4X3/8  BEN (MISCELLANEOUS) ×2
CONN ST 1/4X3/8 BEN (MISCELLANEOUS) IMPLANT
CONT SPEC 4OZ STRL OR WHT (MISCELLANEOUS) ×4
DERMABOND ADVANCED (GAUZE/BANDAGES/DRESSINGS) ×1
DERMABOND ADVANCED .7 DNX12 (GAUZE/BANDAGES/DRESSINGS) IMPLANT
DRAIN CHANNEL 32F RND 10.7 FF (WOUND CARE) ×1 IMPLANT
DRAPE LAPAROSCOPIC ABDOMINAL (DRAPES) ×2 IMPLANT
DRAPE WARM FLUID 44X44 (DRAPES) ×2 IMPLANT
ELECT REM PT RETURN 9FT ADLT (ELECTROSURGICAL) ×2
ELECTRODE REM PT RTRN 9FT ADLT (ELECTROSURGICAL) ×1 IMPLANT
GAUZE 4X4 16PLY ~~LOC~~+RFID DBL (SPONGE) ×2 IMPLANT
GAUZE SPONGE 4X4 12PLY STRL (GAUZE/BANDAGES/DRESSINGS) ×2 IMPLANT
GLOVE BIOGEL M STRL SZ7.5 (GLOVE) ×2 IMPLANT
GOWN STRL REUS W/ TWL LRG LVL3 (GOWN DISPOSABLE) ×3 IMPLANT
GOWN STRL REUS W/TWL LRG LVL3 (GOWN DISPOSABLE) ×6
KIT BASIN OR (CUSTOM PROCEDURE TRAY) ×2 IMPLANT
KIT SUCTION CATH 14FR (SUCTIONS) ×2 IMPLANT
KIT TURNOVER KIT B (KITS) ×2 IMPLANT
NS IRRIG 1000ML POUR BTL (IV SOLUTION) ×8 IMPLANT
PACK CHEST (CUSTOM PROCEDURE TRAY) ×2 IMPLANT
SOL ANTI FOG 6CC (MISCELLANEOUS) ×1 IMPLANT
SOLUTION ANTI FOG 6CC (MISCELLANEOUS) ×1
SPONGE T-LAP 18X18 ~~LOC~~+RFID (SPONGE) ×6 IMPLANT
SPONGE TONSIL TAPE 1 RFD (DISPOSABLE) ×2 IMPLANT
SUT SILK  1 MH (SUTURE) ×4
SUT SILK 1 MH (SUTURE) IMPLANT
SUT VIC AB 2 TP1 27 (SUTURE) ×1 IMPLANT
SUT VIC AB 2-0 CT1 18 (SUTURE) ×1 IMPLANT
SUT VIC AB 2-0 CT2 18 VCP726D (SUTURE) ×1 IMPLANT
SUT VIC AB 2-0 CTX 36 (SUTURE) ×1 IMPLANT
SWAB COLLECTION DEVICE MRSA (MISCELLANEOUS) ×1 IMPLANT
SWAB CULTURE ESWAB REG 1ML (MISCELLANEOUS) ×1 IMPLANT
SYSTEM SAHARA CHEST DRAIN ATS (WOUND CARE) ×2 IMPLANT
TAPE CLOTH 4X10 WHT NS (GAUZE/BANDAGES/DRESSINGS) ×1 IMPLANT
TOWEL GREEN STERILE (TOWEL DISPOSABLE) ×2 IMPLANT
TOWEL GREEN STERILE FF (TOWEL DISPOSABLE) ×2 IMPLANT
TRAY FOLEY MTR SLVR 16FR STAT (SET/KITS/TRAYS/PACK) ×2 IMPLANT
WATER STERILE IRR 1000ML POUR (IV SOLUTION) ×4 IMPLANT

## 2021-08-03 NOTE — Anesthesia Procedure Notes (Signed)
Procedure Name: Intubation Date/Time: 08/03/2021 12:00 PM  Performed by: Lavell Luster, CRNAPre-anesthesia Checklist: Patient identified, Emergency Drugs available, Suction available, Patient being monitored and Timeout performed Patient Re-evaluated:Patient Re-evaluated prior to induction Oxygen Delivery Method: Circle system utilized Preoxygenation: Pre-oxygenation with 100% oxygen Induction Type: IV induction Ventilation: Two handed mask ventilation required and Oral airway inserted - appropriate to patient size Laryngoscope Size: Mac, 3 and Glidescope Grade View: Grade I Tube type: Oral Tube size: 7.5 mm Number of attempts: 1 Placement Confirmation: ETT inserted through vocal cords under direct vision, positive ETCO2 and breath sounds checked- equal and bilateral Secured at: 22 cm Tube secured with: Tape Dental Injury: Teeth and Oropharynx as per pre-operative assessment

## 2021-08-03 NOTE — Transfer of Care (Signed)
Immediate Anesthesia Transfer of Care Note  Patient: Arnold Fehl  Procedure(s) Performed: VIDEO ASSISTED THORACOSCOPY (VATS)/HEMOTHORAX (Left: Chest) PAIN PUMP INSERTION (Left: Chest) MINI/LIMITED THORACOTOMY (Left: Chest)  Patient Location: SICU  Anesthesia Type:General  Level of Consciousness: Patient remains intubated per anesthesia plan  Airway & Oxygen Therapy: Patient remains intubated per anesthesia plan and Patient placed on Ventilator (see vital sign flow sheet for setting)  Post-op Assessment: Post -op Vital signs reviewed and stable  Post vital signs: stable  Last Vitals:  Vitals Value Taken Time  BP    Temp    Pulse 132 08/03/21 1236  Resp 18 08/03/21 1236  SpO2 98 % 08/03/21 1236  Vitals shown include unvalidated device data.  Last Pain:  Vitals:   08/03/21 0746  TempSrc: Oral  PainSc: 8       Patients Stated Pain Goal: 0 (08/03/21 0746)  Complications: No notable events documented.

## 2021-08-03 NOTE — Progress Notes (Signed)
1340: Sat steadily 90%, RT notified, FiO2 increased to 100% per RT.   1345: Aline gas checked.   1358 Debbe Bales S. NP at bedside, aware of gas, rate increased to 16, RT notified. Will check gas in 1 hour.

## 2021-08-03 NOTE — Anesthesia Postprocedure Evaluation (Signed)
Anesthesia Post Note  Patient: Tracy Garcia  Procedure(s) Performed: VIDEO ASSISTED THORACOSCOPY (VATS)/HEMOTHORAX (Left: Chest) PAIN PUMP INSERTION (Left: Chest) MINI/LIMITED THORACOTOMY (Left: Chest)     Patient location during evaluation: SICU Anesthesia Type: General Level of consciousness: sedated Pain management: pain level controlled Vital Signs Assessment: post-procedure vital signs reviewed and stable Respiratory status: patient remains intubated per anesthesia plan Cardiovascular status: stable Postop Assessment: no apparent nausea or vomiting Anesthetic complications: no   No notable events documented.  Last Vitals:  Vitals:   08/03/21 1232 08/03/21 1242  BP: (!) 174/76 (!) 152/70  Pulse: (!) 138 (!) 129  Resp: 18 14  Temp:    SpO2: 97% 94%    Last Pain:  Vitals:   08/03/21 0746  TempSrc: Oral  PainSc: 8                  Lucretia Kern

## 2021-08-03 NOTE — Anesthesia Procedure Notes (Signed)
Central Venous Catheter Insertion Patient location: Pre-op. Preanesthetic checklist: patient identified, IV checked, site marked, risks and benefits discussed, surgical consent, monitors and equipment checked, pre-op evaluation, timeout performed and anesthesia consent Position: supine Patient sedated Hand hygiene performed , maximum sterile barriers used  and Seldinger technique used Catheter size: 8 Fr Total catheter length 16. Central line was placed.Double lumen Procedure performed using ultrasound guided technique. Ultrasound Notes:anatomy identified, needle tip was noted to be adjacent to the nerve/plexus identified, no ultrasound evidence of intravascular and/or intraneural injection and image(s) printed for medical record Attempts: 1 Following insertion, dressing applied, line sutured and Biopatch. Post procedure assessment: blood return through all ports  Patient tolerated the procedure well with no immediate complications.

## 2021-08-03 NOTE — Anesthesia Procedure Notes (Addendum)
Procedure Name: Intubation Date/Time: 08/03/2021 10:25 AM  Performed by: Lavell Luster, CRNAPre-anesthesia Checklist: Patient identified, Emergency Drugs available, Suction available, Patient being monitored and Timeout performed Patient Re-evaluated:Patient Re-evaluated prior to induction Oxygen Delivery Method: Circle system utilized Preoxygenation: Pre-oxygenation with 100% oxygen Induction Type: IV induction Ventilation: Mask ventilation without difficulty Laryngoscope Size: Mac and 4 Grade View: Grade II Tube type: Oral Endobronchial tube: EBT position confirmed by fiberoptic bronchoscope and Left and 39 Fr Number of attempts: 1 Airway Equipment and Method: Stylet Placement Confirmation: ETT inserted through vocal cords under direct vision, positive ETCO2 and breath sounds checked- equal and bilateral Tube secured with: Tape Dental Injury: Teeth and Oropharynx as per pre-operative assessment

## 2021-08-03 NOTE — Progress Notes (Signed)
Tracy Garcia, MRN:  [id number], DOB:  [birth year], LOS: 4 ADMISSION DATE:  07/30/2021, CONSULTATION DATE:  6/18 REFERRING MD:  Elgergawy, CHIEF COMPLAINT:  acute pulmonary emboli with persistent chest pain, dyspnea and tachycardia  History of Present Illness:  36 year old female with history as mentioned below. Initially went to her primary care provider's office with chief complaint of about 2-week history of worsening right lower extremity pain and swelling.  Found to have acute deep vein thrombosis, and Xarelto was initiated.  She is an active smoker, she is on birth control form of NuvaRing.  Endorses fairly sedentary lifestyle.  Presented to see the ER 6/15 with chief complaint of fairly significant pleuritic type chest pain, back pain, and shortness of breath.  Was found to be tachycardic on arrival with heart rate in the 100s a CT of chest was obtained showing fairly extensive left-sided pulmonary emboli without evidence of right heart strain there was fairly significant left-sided airspace disease consistent with probable infarct she was started on IV heparin and admitted to the medical ward.  Over the time period from admission on 6/15 through 6/18 she has had persistent ongoing pleuritic type chest pain which has not improved, ongoing tachypnea with shallow respiratory efforts due to pain, and persistent tachycardia which is worsened over the last 48 hours, prior to critical care consult on 6/18 obtained for concern regarding possible worsening symptoms  Pertinent  Medical History  Anxiety, depression, high cholesterol, hand tremor, migraines. Tobacco abuse  Significant Hospital Events: Including procedures, antibiotic start and stop dates in addition to other pertinent events   6/15 admitted with acute left-sided pulmonary emboli with what appears to be left-sided pulmonary infarct, outpatient Ultrasound from [facility] showed right saphenofemoral junction DVT which extended into the common  femoral vein.  Started on heparin 6/16 echocardiogram: LVEF 55% RV normal normal RV systolic function 6/18 pulmonary asked to evaluate for worsening tachycardia, ongoing pain, and tachypnea.  Stat CT showing decreased clot burden but new loculated large right pleural effusion worsening consolidation.  Moved to ICU, 2 L crystalloid challenge administered, lactate sent, IV vancomycin and cefepime initiated, heparin placed on hold chest tube placed  Interim History / Subjective:  Returns from OR s/p left VATS intubated and sedated on propofol.  S/p rocuronium ~1215  Objective   Blood pressure (!) 148/87, pulse (!) 129, temperature 98.4 F (36.9 C), temperature source Oral, resp. rate (!) 28, height 6' (1.829 m), weight 115.8 kg, last menstrual period 07/30/2021, SpO2 97 %.        Intake/Output Summary (Last 24 hours) at 08/03/2021 1249 Last data filed at 08/03/2021 1213 Gross per 24 hour  Intake 3995 ml  Output 2350 ml  Net 1645 ml    Filed Weights   07/30/21 1506 07/31/21 1350  Weight: 112.9 kg 115.8 kg    Examination: General:  critically ill adult female intubated and sedated on MV HEENT: MM pink/moist, ETT, OGT, pupils 3/sluggish Neuro: sedated/ paralyzed CV: rr, ST 130's, left radial aline, DL R IJ CVL PULM:  MV supported breaths, diffuse insp/ exp wheeze and rhonchi, left Ptx2- no airleak, left on-q pump present, PIP mid 30s, plat 28 GI: obese, +bs, ND, foley Extremities: warm/dry, no LE edema Skin: no rashes or lesions   Resolved Hospital Problem list     Assessment & Plan:  Principal Problem:   Acute pulmonary embolism (HCC) Active Problems:   Pulmonary infarction (HCC)   Pleural effusion on right   Sepsis (HCC)  DVT, lower extremity (HCC)   Tachycardia   Chest pain   Anxiety   High cholesterol   Acute hypoxic respiratory failure post procedure  - cont MV support,  PRVC 8cc/kg IBW with goal Pplat <30 and DP<15  - VAP prevention protocol/ PPI - CXR and ABG  in 1hr - some concern for possible cuff leak.  Getting volumes at this time.  Monitor closely- may need tube exchange vs hopeful extubation - PAD protocol for sedation> change propofol to precedex to help with anxiety and weaning, continue fentanyl with prn versed.  Has on-q pain pump in place to assist with post op pain as well with scheduled tylenol and bowel regimen - wean FiO2 as able for SpO2 >92%  - hopeful to extubate later this afternoon  Left empyema and hemothorax  Plan - s/p Left thora per Dr. Donata Clay 6/19 with placement of On-q pain pump - pleural tubes per TCTS- closely monitor output - follow pleural cultures and studies - continue vanc/ cefepime for now - monitor PT output/ H/H closely  - pain management as above  Acute RLE DVT and Left Pulmonary emboli w/ associated pulmonary infarct - Provoking factors appear to be smoking, birth control, and sedentary lifestyle - plan to restart heparin per pharmacy, no bolus   Sepsis secondary to pneumonia with left empyema following pulmonary infarct Plan - continue abx > vanc/ cefepime, narrow as culture data allows - cont MIVF LR 157ml/hr - trend WBC/ fever curve - follow cultures   Chest pain secondary to complicated right pleural effusion Plan - multimodal pain management as above   Hyperlipidemia Plan - Continue Crestor per tube for now  Best Practice (right click and "Reselect all SmartList Selections" daily)   Diet/type: NPO- in anticipation of extubation hopefully later this afternoon DVT prophylaxis: systemic heparin GI prophylaxis: PPI Lines: Central line and Arterial Line Foley:  Yes, and it is still needed Code Status:  full code Last date of multidisciplinary goals of care discussion [pending ]  Labs   CBC: Recent Labs  Lab 07/30/21 1510 07/31/21 0345 08/01/21 0408 08/02/21 0131 08/03/21 0140 08/03/21 1051  WBC 22.7* 23.6* 24.5* 22.7* 18.8*  --   NEUTROABS  --  19.8*  --   --   --   --   HGB  14.6 12.9 12.4 12.5 11.9* 10.2*  HCT 41.2 35.6* 34.0* 35.0* 33.0* 30.0*  MCV 91.8 89.0 88.1 88.6 89.7  --   PLT 281 254 223 249 247  --      Basic Metabolic Panel: Recent Labs  Lab 07/30/21 1510 07/31/21 0345 08/02/21 0131 08/03/21 0140 08/03/21 1051  NA 136 132* 133* 134* 134*  K 3.9 3.9 3.7 3.9 3.8  CL 103 99 102 103  --   CO2 20* 20* 20* 23  --   GLUCOSE 125* 116* 120* 120*  --   BUN 8 10 8 7   --   CREATININE 1.01* 0.97 0.87 0.85  --   CALCIUM 9.6 8.8* 8.4* 8.1*  --   MG  --  1.4*  --   --   --   PHOS  --  3.3  --   --   --     GFR: Estimated Creatinine Clearance: 132.8 mL/min (by C-G formula based on SCr of 0.85 mg/dL). Recent Labs  Lab 07/31/21 0345 08/01/21 0408 08/02/21 0131 08/02/21 1007 08/02/21 1257 08/03/21 0140  PROCALCITON 0.31  --  0.70  --   --  0.54  WBC  23.6* 24.5* 22.7*  --   --  18.8*  LATICACIDVEN  --   --  0.7 1.2 1.5  --      Liver Function Tests: Recent Labs  Lab 07/30/21 2145 07/31/21 0345  AST 11* 12*  ALT 12 12  ALKPHOS 53 57  BILITOT 1.2 1.0  PROT 7.3 6.9  ALBUMIN 3.4* 3.1*    No results for input(s): "LIPASE", "AMYLASE" in the last 168 hours. No results for input(s): "AMMONIA" in the last 168 hours.  ABG    Component Value Date/Time   PHART 7.205 (L) 08/03/2021 1051   PCO2ART 56.9 (H) 08/03/2021 1051   PO2ART 156 (H) 08/03/2021 1051   HCO3 22.5 08/03/2021 1051   TCO2 24 08/03/2021 1051   ACIDBASEDEF 6.0 (H) 08/03/2021 1051   O2SAT 99 08/03/2021 1051     Coagulation Profile: Recent Labs  Lab 07/30/21 1930  INR 1.4*     Cardiac Enzymes: No results for input(s): "CKTOTAL", "CKMB", "CKMBINDEX", "TROPONINI" in the last 168 hours.  HbA1C: No results found for: "HGBA1C"  CBG: No results for input(s): "GLUCAP" in the last 168 hours.     Critical care time: 32 min      Posey Boyer, ACNP [facility] 08/03/2021, 12:49 PM  See Amion for pager If no response to pager, please call  PCCM consult pager After 7:00 pm call Elink

## 2021-08-03 NOTE — Op Note (Unsigned)
NAMEAngelly Garcia, Tracy Garcia MEDICAL RECORD NO: [id number] ACCOUNT NO: [id number] DATE OF BIRTH: August 11, 1985 FACILITY: [facility] LOCATION: [facility] PHYSICIAN: Kerin Perna III, MD  Operative Report   DATE OF PROCEDURE: 08/03/2021  OPERATION:   1.  Left VATS (video-assisted thoracoscopic surgery) for drainage of left empyema and decortication of left lung. 2.  Placement of On-Q wound analgesia irrigation system.  PREOPERATIVE DIAGNOSES: 1.  Left pleural loculated effusion with compression of left lower lobe and respiratory distress. 2.  Recent DVT and pulmonary embolus to the left lower lobe. 3.  Obesity, BMI greater than 35.  POSTOPERATIVE DIAGNOSES: 1.  Left pleural loculated effusion with compression of left lower lobe and respiratory distress. 2.  Recent DVT and pulmonary embolus to the left lower lobe. 3.  Obesity, BMI greater than 35.  SURGEON:  Mikey Bussing, MD  ASSISTANT:  Doree Fudge, PA-C.  Surgical first assistant was required to complete this operation safely due to its complexity, and risk of bleeding, and damage to the lung with decortication of the fibrinous exudate.  ANESTHESIA:  General.  DESCRIPTION OF PROCEDURE:  The patient was brought from preoperative holding area where informed consent was documented and the proper site of surgery was marked.  All questions were answered.  The patient was then brought to the operating room and placed supine on the operating table and induced for general anesthesia with a double lumen tube.  The patient was then rolled left side up and the left chest was prepped and draped as a sterile  field.  A proper timeout was performed.  A small incision was made in the left anterior axillary line at the sixth interspace and a port for the VATS camera was inserted.  There was a loculated fluid and poor visualization initially.  A small working incision was then made anterior to the scapula in the fifth interspace.  The chest was entered  and clear straw-colored fluid was removed.  There is no bloody fluid or clots.  There was immediately a large amount of fibrinous exudate and a  thick peel on the left lower lobe with extension of areas of pleural peel on the left upper lobe as well.  All this material was carefully removed to decorticate the lower lobe and the upper lobe of the fibrinous peel.  Part of this was sent for culture  as was the pleural fluid.  After extensive time removing the fibrinous proteinaceous peel from the lung the hemithorax was irrigated with warm saline.  Chest tubes were placed anteriorly and posteriorly and brought out through separate incisions and secured to the skin.  A 2  pericostal sutures were used to reapproximate the ribs and before the sutures were tied the lung was reexpanded under direct vision and reinflated normally.  The ribs were reapproximated.  The serratus muscle was reapproximated with interrupted #1  Vicryl.  The subcutaneous and skin layers were closed with a running Vicryl.  The port incision for the camera was then closed in layers using Vicryl.  An On-Q catheter was placed beneath the main incision above the chest tube sites in the chest wall for infiltration of Marcaine 0.5%.  The catheter was secured to the skin with a 3-0 silk and connected to the reservoir bulb.  The patient was then turned supine and the double lumen tube was exchanged for a single-lumen tube and the patient was then transferred to the ICU in stable condition.   PUS D: 08/03/2021 6:40:46 pm T: 08/03/2021 7:31:00  pm  JOB: [id number]

## 2021-08-03 NOTE — Progress Notes (Signed)
eLink Physician-Brief Progress Note Patient Name: Tracy Garcia DOB: [birth year] MRN: [id number]   Date of Service  08/03/2021  HPI/Events of Note  Pt complained of severe pain and does not have anything due at this time.   Pt is awake and alert on camera assessment. BP 142/85, HR 120s, RR 29, O2 sats 95%.   eICU Interventions  Increase frequency of dilaudid to q3hrs PRN.      Intervention Category Intermediate Interventions: Pain - evaluation and management  Larinda Buttery 08/03/2021, 6:06 AM

## 2021-08-03 NOTE — Anesthesia Preprocedure Evaluation (Addendum)
Anesthesia Evaluation  Patient identified by MRN, date of birth, ID band Patient awake    Reviewed: Allergy & Precautions, NPO status , Patient's Chart, lab work & pertinent test results  History of Anesthesia Complications Negative for: history of anesthetic complications  Airway Mallampati: II  TM Distance: >3 FB Neck ROM: Full    Dental  (+) Dental Advisory Given, Poor Dentition   Pulmonary Current Smoker and Patient abstained from smoking., PE   + rhonchi        Cardiovascular + DVT   Rhythm:Regular Rate:Tachycardia     Neuro/Psych negative neurological ROS     GI/Hepatic negative GI ROS, Neg liver ROS,   Endo/Other  negative endocrine ROS  Renal/GU negative Renal ROS  negative genitourinary   Musculoskeletal negative musculoskeletal ROS (+)   Abdominal   Peds  Hematology  (+) Blood dyscrasia, anemia ,   Anesthesia Other Findings  RLE DVT with extensive L side pulmonary embolism and R side empyema - developed hemothorax after attempted drainage  Reproductive/Obstetrics                          Anesthesia Physical Anesthesia Plan  ASA: 4  Anesthesia Plan: General   Post-op Pain Management: Ofirmev IV (intra-op)* and Toradol IV (intra-op)*   Induction: Intravenous  PONV Risk Score and Plan: 2 and Ondansetron, Dexamethasone, Treatment may vary due to age or medical condition and Midazolam  Airway Management Planned: Oral ETT and Double Lumen EBT  Additional Equipment: Arterial line  Intra-op Plan:   Post-operative Plan: Possible Post-op intubation/ventilation  Informed Consent: I have reviewed the patients History and Physical, chart, labs and discussed the procedure including the risks, benefits and alternatives for the proposed anesthesia with the patient or authorized representative who has indicated his/her understanding and acceptance.     Dental advisory  given  Plan Discussed with:   Anesthesia Plan Comments:        Anesthesia Quick Evaluation

## 2021-08-03 NOTE — Brief Op Note (Signed)
08/03/2021  11:45 AM  PATIENT:  Tracy Garcia  36 y.o. female  PRE-OPERATIVE DIAGNOSIS:  1. Left Hemothorax 2. Pulmonary Embolus  POST-OPERATIVE DIAGNOSIS:  1. Left empyema 2. Pulmonary Embolus  PROCEDURE:  LEFT VIDEO ASSISTED THORACOSCOPY (VATS), LEFT MINI/LIMITED THORACOTOMY, DRAIN LEFT EMPYEMA/PLEURAL EFFUSION, DECORTICATION, and ON Q PLACEMENT FINDINGS: Approximately 800 cc left pleural fluid removed  SURGEON:  Surgeon(s) and Role:    Kerin Perna, MD - Primary  PHYSICIAN ASSISTANT: Doree Fudge PA-C  ANESTHESIA:   general  EBL:  Minimal  BLOOD ADMINISTERED:none  DRAINS:  28 and 32 Blake drains placed in the left pleural space    LOCAL MEDICATIONS USED:  MARCAINE     SPECIMEN:  Source of Specimen:  Left pleural fluid and peel  DISPOSITION OF SPECIMEN:   Culture and pathology  COUNTS CORRECT:  YES  DICTATION: .Dragon Dictation  PLAN OF CARE: Admit to inpatient   PATIENT DISPOSITION:  ICU - intubated and hemodynamically stable.   Delay start of Pharmacological VTE agent (>24hrs) due to surgical blood loss or risk of bleeding: no

## 2021-08-03 NOTE — Progress Notes (Signed)
Received report from nightshift RN, Colin Mulders. Immediately after surgery transport came to take patient down to Pre-Op. This RN, transferred patient and gave report to Koleen Nimrod, Charity fundraiser. Patient was alert and oriented x4. Vital Signs were stable.

## 2021-08-03 NOTE — Progress Notes (Signed)
Pre Procedure note for inpatients:   Prisila Huser has been scheduled for Procedure(s): VIDEO ASSISTED THORACOSCOPY (VATS)/HEMOTHORAX (Left) today. The various methods of treatment have been discussed with the patient. After consideration of the risks, benefits and treatment options the patient has consented to the planned procedure.   The patient has been seen and labs reviewed. There are no changes in the patient's condition to prevent proceeding with the planned procedure today.  Recent labs:  Lab Results  Component Value Date   WBC 18.8 (H) 08/03/2021   HGB 11.9 (L) 08/03/2021   HCT 33.0 (L) 08/03/2021   PLT 247 08/03/2021   GLUCOSE 120 (H) 08/03/2021   ALT 12 07/31/2021   AST 12 (L) 07/31/2021   NA 134 (L) 08/03/2021   K 3.9 08/03/2021   CL 103 08/03/2021   CREATININE 0.85 08/03/2021   BUN 7 08/03/2021   CO2 23 08/03/2021   TSH 3.108 08/02/2021   INR 1.4 (H) 07/30/2021    Lovett Sox, MD 08/03/2021 6:37 AM

## 2021-08-03 NOTE — Progress Notes (Signed)
ANTICOAGULATION CONSULT NOTE - Follow Up Consult  Pharmacy Consult for Heparin infusion  Indication: pulmonary embolus and RLE DVT  Allergies  Allergen Reactions   Shellfish Allergy Hives    Itching     Patient Measurements: Height: 6' (182.9 cm) Weight: 115.8 kg (255 lb 4.7 oz) IBW/kg (Calculated) : 73.1 Heparin Dosing Weight: 100 kg  Vital Signs: Temp: 98 F (36.7 C) (06/19 2000) Temp Source: Oral (06/19 2000) BP: 116/71 (06/19 2100) Pulse Rate: 87 (06/19 2100)  Labs: Recent Labs    08/01/21 0408 08/01/21 1212 08/01/21 1839 08/02/21 0131 08/02/21 0804 08/02/21 1007 08/02/21 1740 08/03/21 0140 08/03/21 1051 08/03/21 1350 08/03/21 1535 08/03/21 2204  HGB 12.4  --   --  12.5  --   --   --  11.9*   < > 10.5* 9.9* 10.5*  HCT 34.0*  --   --  35.0*  --   --   --  33.0*   < > 31.0* 29.0* 28.6*  PLT 223  --   --  249  --   --   --  247  --   --   --   --   APTT 59* 78* 77* 65*  --   --   --   --   --   --   --   --   HEPARINUNFRC 0.41 0.49  --  0.34  --   --  <0.10*  --   --   --   --  <0.10*  CREATININE  --   --   --  0.87  --   --   --  0.85  --   --   --   --   TROPONINIHS  --   --   --   --  12 10  --   --   --   --   --   --    < > = values in this interval not displayed.     Estimated Creatinine Clearance: 132.8 mL/min (by C-G formula based on SCr of 0.85 mg/dL).   Assessment: 36 yo F presented to ED with SOB, L-sided chest pain worsening when taking deep breaths and nonproductive cough. Diagnosed with DVT recently by PCP and initiated on Xarelto (Last dose  was 6/14 @ 1600.   He is noted with a hemothorax s/p left VATS and evacuation of hematoma 6/19. Heparin restarted post-op with no bolus and conservative dosing (she was on 2350 units/hr pre-op). Heparin level undetectable on infusion at 1000 units/hr. No bleeding noted per RN. CBC stable.  Goal of Therapy:  Heparin level 0.3-0.7 units/ml  Monitor platelets by anticoagulation protocol: Yes   Plan:   Increase heparin to 1300 units/hr and titrate slowly, no boluses Heparin level in 6 hours  Christoper Fabian, PharmD, BCPS Please see amion for complete clinical pharmacist phone list 08/03/2021 10:54 PM

## 2021-08-03 NOTE — Progress Notes (Signed)
ANTICOAGULATION CONSULT NOTE - Follow Up Consult  Pharmacy Consult for Heparin infusion  Indication: pulmonary embolus and RLE DVT  Allergies  Allergen Reactions   Shellfish Allergy Hives    Itching     Patient Measurements: Height: 6' (182.9 cm) Weight: 115.8 kg (255 lb 4.7 oz) IBW/kg (Calculated) : 73.1 Heparin Dosing Weight: 100 kg  Vital Signs: Temp: 98.4 F (36.9 C) (06/19 0746) Temp Source: Oral (06/19 0746) BP: 152/70 (06/19 1242) Pulse Rate: 129 (06/19 1242)  Labs: Recent Labs    08/01/21 0408 08/01/21 1212 08/01/21 1839 08/02/21 0131 08/02/21 0804 08/02/21 1007 08/02/21 1740 08/03/21 0140 08/03/21 1051  HGB 12.4  --   --  12.5  --   --   --  11.9* 10.2*  HCT 34.0*  --   --  35.0*  --   --   --  33.0* 30.0*  PLT 223  --   --  249  --   --   --  247  --   APTT 59* 78* 77* 65*  --   --   --   --   --   HEPARINUNFRC 0.41 0.49  --  0.34  --   --  <0.10*  --   --   CREATININE  --   --   --  0.87  --   --   --  0.85  --   TROPONINIHS  --   --   --   --  12 10  --   --   --      Estimated Creatinine Clearance: 132.8 mL/min (by C-G formula based on SCr of 0.85 mg/dL).   Medications:  Medications Prior to Admission  Medication Sig Dispense Refill Last Dose   NURTEC 75 MG TBDP Take 1 tablet by mouth daily as needed (pain).   Past Week   NUVARING 0.12-0.015 MG/24HR vaginal ring Place 1 each vaginally every 28 (twenty-eight) days.   07/30/2021 at ongoing   Rivaroxaban (XARELTO STARTER PACK PO) Take 15-20 mg by mouth as directed. Take 15 mg tablet BID for 21 Days. Then On Day 22 Switch to Take 20 mg tablet Daily   07/29/2021 at 1600   rosuvastatin (CRESTOR) 20 MG tablet Take 20 mg by mouth at bedtime.   07/29/2021    Assessment: 36 yo F presented to ED with SOB, L-sided chest pain worsening when taking deep breaths and nonproductive cough. Diagnosed with DVT recently by PCP and initiated on Xarelto (Last dose  was 6/14 @ 1600.   He is noted with a hemothorax s/p   left VATS and evacuation of hematoma. Heparin to restart (she was on 2350 units/hr pre-op). Pharmacy to restart heparin with no bolus  Goal of Therapy:  Heparin level 0.3-0.7 units/ml Monitor platelets by anticoagulation protocol: Yes   Plan:  -Restart heparin at 1000 units/hr and titrate slowly -Heparin level in 6 hours and daily wth CBC daily  Harland German, PharmD Clinical Pharmacist **Pharmacist phone directory can now be found on [email or web address] ([email or web address]).  Listed under [facility].

## 2021-08-03 NOTE — Care Management (Signed)
  Transition of Care (TOC) Screening Note   Patient Details  Name: Tracy Garcia Date of Birth: 1985-09-22   [facility] CM/SW Contact:    Gala Lewandowsky, RN Phone Number: 08/03/2021, 5:03 PM    Transition of Care Department [facility]) has reviewed the patient and no TOC needs have been identified at this time. Benefits check has been competed for Eliquis. Case Manager will continue to follow for additional transition of care needs as the patient progresses.

## 2021-08-03 NOTE — Progress Notes (Signed)
      [address]       [place] [address]             [phone]       S/p VATS drainage of left empyema, decortication  Intubated, sedated  BP 110/73   Pulse 86   Temp 98.2 F (36.8 C) (Axillary)   Resp 20   Ht 6' (1.829 m)   Wt 115.8 kg   LMP 07/30/2021 (Exact Date)   SpO2 99%   BMI 34.62 kg/m   Intake/Output Summary (Last 24 hours) at 08/03/2021 1836 Last data filed at 08/03/2021 1700 Gross per 24 hour  Intake 4543.64 ml  Output 2350 ml  Net 2193.64 ml   CT approx 250 ml no air leak  Viviann Spare C. Dorris Fetch, MD [facility] [phone]

## 2021-08-04 ENCOUNTER — Inpatient Hospital Stay (HOSPITAL_COMMUNITY): Payer: 59

## 2021-08-04 ENCOUNTER — Encounter (HOSPITAL_COMMUNITY): Payer: Self-pay | Admitting: Cardiothoracic Surgery

## 2021-08-04 DIAGNOSIS — J869 Pyothorax without fistula: Secondary | ICD-10-CM | POA: Diagnosis not present

## 2021-08-04 DIAGNOSIS — I2699 Other pulmonary embolism without acute cor pulmonale: Secondary | ICD-10-CM | POA: Diagnosis not present

## 2021-08-04 DIAGNOSIS — I824Y1 Acute embolism and thrombosis of unspecified deep veins of right proximal lower extremity: Secondary | ICD-10-CM | POA: Diagnosis not present

## 2021-08-04 LAB — POCT I-STAT 7, (LYTES, BLD GAS, ICA,H+H)
Acid-base deficit: 4 mmol/L — ABNORMAL HIGH (ref 0.0–2.0)
Acid-base deficit: 3 mmol/L — ABNORMAL HIGH (ref 0.0–2.0)
Acid-base deficit: 3 mmol/L — ABNORMAL HIGH (ref 0.0–2.0)
Bicarbonate: 20.2 mmol/L (ref 20.0–28.0)
Bicarbonate: 21.4 mmol/L (ref 20.0–28.0)
Bicarbonate: 21.3 mmol/L (ref 20.0–28.0)
Calcium, Ion: 0.98 mmol/L — ABNORMAL LOW (ref 1.15–1.40)
Calcium, Ion: 1.1 mmol/L — ABNORMAL LOW (ref 1.15–1.40)
Calcium, Ion: 1.06 mmol/L — ABNORMAL LOW (ref 1.15–1.40)
HCT: 25 % — ABNORMAL LOW (ref 36.0–46.0)
HCT: 25 % — ABNORMAL LOW (ref 36.0–46.0)
HCT: 25 % — ABNORMAL LOW (ref 36.0–46.0)
Hemoglobin: 8.5 g/dL — ABNORMAL LOW (ref 12.0–15.0)
Hemoglobin: 8.5 g/dL — ABNORMAL LOW (ref 12.0–15.0)
Hemoglobin: 8.5 g/dL — ABNORMAL LOW (ref 12.0–15.0)
O2 Saturation: 99 %
O2 Saturation: 99 %
O2 Saturation: 96 %
Patient temperature: 98
Patient temperature: 98.3
Patient temperature: 98
Potassium: 4 mmol/L (ref 3.5–5.1)
Potassium: 3.9 mmol/L (ref 3.5–5.1)
Potassium: 3.8 mmol/L (ref 3.5–5.1)
Sodium: 136 mmol/L (ref 135–145)
Sodium: 135 mmol/L (ref 135–145)
Sodium: 136 mmol/L (ref 135–145)
TCO2: 21 mmol/L — ABNORMAL LOW (ref 22–32)
TCO2: 22 mmol/L (ref 22–32)
TCO2: 22 mmol/L (ref 22–32)
pCO2 arterial: 32.4 mmHg (ref 32–48)
pCO2 arterial: 33.3 mmHg (ref 32–48)
pCO2 arterial: 32.2 mmHg (ref 32–48)
pH, Arterial: 7.402 (ref 7.35–7.45)
pH, Arterial: 7.415 (ref 7.35–7.45)
pH, Arterial: 7.427 (ref 7.35–7.45)
pO2, Arterial: 133 mmHg — ABNORMAL HIGH (ref 83–108)
pO2, Arterial: 150 mmHg — ABNORMAL HIGH (ref 83–108)
pO2, Arterial: 76 mmHg — ABNORMAL LOW (ref 83–108)

## 2021-08-04 LAB — CBC
HCT: 27.6 % — ABNORMAL LOW (ref 36.0–46.0)
Hemoglobin: 9.7 g/dL — ABNORMAL LOW (ref 12.0–15.0)
MCH: 31.6 pg (ref 26.0–34.0)
MCHC: 35.1 g/dL (ref 30.0–36.0)
MCV: 89.9 fL (ref 80.0–100.0)
Platelets: 230 10*3/uL (ref 150–400)
RBC: 3.07 MIL/uL — ABNORMAL LOW (ref 3.87–5.11)
RDW: 12.1 % (ref 11.5–15.5)
WBC: 18.9 10*3/uL — ABNORMAL HIGH (ref 4.0–10.5)
nRBC: 0 % (ref 0.0–0.2)

## 2021-08-04 LAB — BASIC METABOLIC PANEL
Anion gap: 9 (ref 5–15)
BUN: 12 mg/dL (ref 6–20)
CO2: 20 mmol/L — ABNORMAL LOW (ref 22–32)
Calcium: 7.5 mg/dL — ABNORMAL LOW (ref 8.9–10.3)
Chloride: 107 mmol/L (ref 98–111)
Creatinine, Ser: 0.74 mg/dL (ref 0.44–1.00)
GFR, Estimated: >60 mL/min (ref >60)
Glucose, Bld: 174 mg/dL — ABNORMAL HIGH (ref 70–99)
Potassium: 4.3 mmol/L (ref 3.5–5.1)
Sodium: 136 mmol/L (ref 135–145)

## 2021-08-04 LAB — HEPARIN LEVEL (UNFRACTIONATED)
Heparin Unfractionated: <0.1 IU/mL — ABNORMAL LOW (ref 0.30–0.70)
Heparin Unfractionated: 0.1 IU/mL — ABNORMAL LOW (ref 0.30–0.70)
Heparin Unfractionated: 0.21 IU/mL — ABNORMAL LOW (ref 0.30–0.70)

## 2021-08-04 LAB — GLUCOSE, CAPILLARY
Glucose-Capillary: 103 mg/dL — ABNORMAL HIGH (ref 70–99)
Glucose-Capillary: 119 mg/dL — ABNORMAL HIGH (ref 70–99)
Glucose-Capillary: 124 mg/dL — ABNORMAL HIGH (ref 70–99)
Glucose-Capillary: 145 mg/dL — ABNORMAL HIGH (ref 70–99)
Glucose-Capillary: 164 mg/dL — ABNORMAL HIGH (ref 70–99)
Glucose-Capillary: 178 mg/dL — ABNORMAL HIGH (ref 70–99)
Glucose-Capillary: 204 mg/dL — ABNORMAL HIGH (ref 70–99)

## 2021-08-04 LAB — SURGICAL PATHOLOGY

## 2021-08-04 MED ORDER — SENNOSIDES-DOCUSATE SODIUM 8.6-50 MG PO TABS
1.0000 | ORAL_TABLET | Freq: Every day | ORAL | Status: DC
  Administered 2021-08-04: 1 via ORAL
  Filled 2021-08-04: qty 1

## 2021-08-04 MED ORDER — DIPHENHYDRAMINE HCL 50 MG/ML IJ SOLN: 12.5000 mg | Freq: Four times a day (QID) | INTRAMUSCULAR | Status: DC | PRN

## 2021-08-04 MED ORDER — OXYCODONE HCL 5 MG PO TABS
5.0000 mg | ORAL_TABLET | ORAL | Status: DC | PRN
  Administered 2021-08-05 (×2): 10 mg via ORAL
  Administered 2021-08-06: 5 mg via ORAL
  Administered 2021-08-07 (×2): 10 mg via ORAL
  Administered 2021-08-07: 5 mg via ORAL
  Administered 2021-08-08 (×3): 10 mg via ORAL
  Administered 2021-08-09: 5 mg via ORAL
  Administered 2021-08-09 (×4): 10 mg via ORAL
  Administered 2021-08-09: 5 mg via ORAL
  Administered 2021-08-10: 10 mg via ORAL
  Filled 2021-08-04 (×2): qty 1
  Filled 2021-08-04 (×5): qty 2
  Filled 2021-08-04: qty 1
  Filled 2021-08-04 (×6): qty 2
  Filled 2021-08-04: qty 1
  Filled 2021-08-04 (×2): qty 2

## 2021-08-04 MED ORDER — ALPRAZOLAM 0.5 MG PO TABS
0.5000 mg | ORAL_TABLET | Freq: Four times a day (QID) | ORAL | Status: DC | PRN
  Administered 2021-08-04 – 2021-08-05 (×4): 0.5 mg via ORAL
  Filled 2021-08-04 (×4): qty 1

## 2021-08-04 MED ORDER — ROSUVASTATIN CALCIUM 20 MG PO TABS
20.0000 mg | ORAL_TABLET | Freq: Every day | ORAL | Status: DC
  Administered 2021-08-04 – 2021-08-09 (×6): 20 mg via ORAL
  Filled 2021-08-04 (×6): qty 1

## 2021-08-04 MED ORDER — HYDROMORPHONE 1 MG/ML IV SOLN
INTRAVENOUS | Status: DC
  Administered 2021-08-05: 1.2 mg via INTRAVENOUS
  Administered 2021-08-06: 0.9 mg via INTRAVENOUS
  Filled 2021-08-04: qty 30

## 2021-08-04 MED ORDER — POLYETHYLENE GLYCOL 3350 17 G PO PACK: 17.0000 g | PACK | Freq: Every day | ORAL | Status: DC | PRN

## 2021-08-04 MED ORDER — ONDANSETRON HCL 4 MG/2ML IJ SOLN: 4.0000 mg | Freq: Four times a day (QID) | INTRAMUSCULAR | Status: DC | PRN

## 2021-08-04 MED ORDER — MELATONIN 5 MG PO TABS
5.0000 mg | ORAL_TABLET | Freq: Every evening | ORAL | Status: DC | PRN
  Administered 2021-08-04 – 2021-08-09 (×5): 5 mg via ORAL
  Filled 2021-08-04 (×5): qty 1

## 2021-08-04 MED ORDER — POLYETHYLENE GLYCOL 3350 17 G PO PACK
17.0000 g | PACK | Freq: Every day | ORAL | Status: DC
  Administered 2021-08-05 – 2021-08-08 (×4): 17 g via ORAL
  Filled 2021-08-04 (×4): qty 1

## 2021-08-04 MED ORDER — DOCUSATE SODIUM 100 MG PO CAPS
100.0000 mg | ORAL_CAPSULE | Freq: Two times a day (BID) | ORAL | Status: DC
Start: 2021-08-04 — End: 2021-08-10
  Administered 2021-08-04 – 2021-08-08 (×6): 100 mg via ORAL
  Filled 2021-08-04 (×6): qty 1

## 2021-08-04 MED ORDER — MIDAZOLAM HCL 2 MG/2ML IJ SOLN: 2.0000 mg | INTRAMUSCULAR | Status: DC | PRN

## 2021-08-04 MED ORDER — SODIUM CHLORIDE 0.9% FLUSH: 9.0000 mL | INTRAVENOUS | Status: DC | PRN

## 2021-08-04 MED ORDER — NALOXONE HCL 0.4 MG/ML IJ SOLN: 0.4000 mg | INTRAMUSCULAR | Status: DC | PRN

## 2021-08-04 MED ORDER — INSULIN ASPART 100 UNIT/ML IJ SOLN
0.0000 [IU] | INTRAMUSCULAR | Status: DC
  Administered 2021-08-04 (×2): 2 [IU] via SUBCUTANEOUS
  Administered 2021-08-04: 3 [IU] via SUBCUTANEOUS

## 2021-08-04 MED ORDER — DIPHENHYDRAMINE HCL 12.5 MG/5ML PO ELIX: 12.5000 mg | ORAL_SOLUTION | Freq: Four times a day (QID) | ORAL | Status: DC | PRN

## 2021-08-04 MED ORDER — DIPHENHYDRAMINE HCL 12.5 MG/5ML PO ELIX
12.5000 mg | ORAL_SOLUTION | Freq: Four times a day (QID) | ORAL | Status: DC | PRN
Start: 2021-08-04 — End: 2021-08-04

## 2021-08-04 MED ORDER — ACETAMINOPHEN 500 MG PO TABS
1000.0000 mg | ORAL_TABLET | Freq: Four times a day (QID) | ORAL | Status: AC
  Administered 2021-08-04 – 2021-08-08 (×15): 1000 mg via ORAL
  Filled 2021-08-04 (×16): qty 2

## 2021-08-04 MED ORDER — METOCLOPRAMIDE HCL 5 MG/ML IJ SOLN
10.0000 mg | Freq: Four times a day (QID) | INTRAMUSCULAR | Status: DC
  Administered 2021-08-04 – 2021-08-10 (×15): 10 mg via INTRAVENOUS
  Filled 2021-08-04 (×15): qty 2

## 2021-08-04 MED ORDER — PANTOPRAZOLE SODIUM 40 MG PO TBEC
40.0000 mg | DELAYED_RELEASE_TABLET | Freq: Every day | ORAL | Status: DC
  Administered 2021-08-04 – 2021-08-10 (×7): 40 mg via ORAL
  Filled 2021-08-04 (×7): qty 1

## 2021-08-04 NOTE — Progress Notes (Incomplete)
Initial Nutrition Assessment  DOCUMENTATION CODES:     INTERVENTION:  ***   NUTRITION DIAGNOSIS:    related to   as evidenced by  .  GOAL:     MONITOR:     REASON FOR ASSESSMENT:  Ventilator    ASSESSMENT:  Pt with hx of HLD, tobacco abuse, and DVT presented to ED with chest pain and arm numbness. Imaging in ED showed extensive left-sided pulmonary embolism associated with extensive left lower lobe atelectasis.  After admission, repeat imaging 6/18 showed complicated left pleural effusion with possible empyema.  6/18 - chest tube placed 6/19 - Op, left VATS, drainage of left empyema, decortication, ~800 cc left pleural fluid drained  ***   Patient is currently intubated on ventilator support. NGT in place confirmed gastric MV: 13.4 L/min Temp (24hrs), Avg:97.9 F (36.6 C), Min:97.5 F (36.4 C), Max:98.2 F (36.8 C)   Intake/Output Summary (Last 24 hours) at 08/04/2021 0901 Last data filed at 08/04/2021 0600 Gross per 24 hour  Intake 3201.98 ml  Output 2205 ml  Net 996.98 ml  Net IO Since Admission: 2,858.98 mL [08/04/21 0901] Chest tube: over the last 24 hours  Nutritionally Relevant Medications: Scheduled Meds:  bisacodyl  10 mg Oral Daily   docusate  100 mg Per Tube BID   insulin aspart  0-15 Units Subcutaneous Q4H   metoCLOPramide (REGLAN) injection  10 mg Intravenous Q6H   pantoprazole sodium  40 mg Per Tube Daily   polyethylene glycol  17 g Per Tube Daily   rosuvastatin  20 mg Per Tube QHS   senna-docusate  1 tablet Per Tube QHS   Continuous Infusions:  dexmedetomidine (PRECEDEX) IV infusion 1.6 mcg/kg/hr (08/04/21 0636)   fentaNYL infusion INTRAVENOUS     PRN Meds: ondansetron, polyethylene glycol  Labs Reviewed: Calcium corrects to 8.22 for low albumin, low Mg 1.4 CBG ranges from 141-204 mg/dL over the last 24 hours  NUTRITION - FOCUSED PHYSICAL EXAM: {RD Focused Exam List:21252}  Diet Order:   Diet Order             Diet NPO  time specified  Diet effective now                   EDUCATION NEEDS:     Skin:  Skin Assessment: Reviewed RN Assessment  Last BM:  unsure  Height:  Ht Readings from Last 1 Encounters:  08/04/21 6' (1.829 m)    Weight:  Wt Readings from Last 1 Encounters:  08/04/21 115.6 kg    Ideal Body Weight:     BMI:  Body mass index is 34.56 kg/m.  Estimated Nutritional Needs:  Kcal:  1300-1700 kcal/d Protein:  130-140 g/d Fluid:  >/=1.5L/d   Greig Castilla, RD, LDN Clinical Dietitian RD pager # available in [facility]  After hours/weekend pager # available in [facility]

## 2021-08-04 NOTE — Progress Notes (Addendum)
ANTICOAGULATION CONSULT NOTE  Pharmacy Consult for Heparin  Indication: pulmonary embolus and RLE DVT  Allergies  Allergen Reactions   Shellfish Allergy Hives    Itching     Patient Measurements: Height: 6' (182.9 cm) Weight: 115.6 kg (254 lb 13.6 oz) IBW/kg (Calculated) : 73.1 Heparin Dosing Weight: 100 kg  Vital Signs: Temp: 97.9 F (36.6 C) (06/20 2000) Temp Source: Oral (06/20 2000) BP: 142/72 (06/20 2000) Pulse Rate: 113 (06/20 2000)  Labs: Recent Labs    08/02/21 0131 08/02/21 0804 08/02/21 1007 08/02/21 1740 08/03/21 0140 08/03/21 1051 08/04/21 0346 08/04/21 0351 08/04/21 0920 08/04/21 1101 08/04/21 1111 08/04/21 2023  HGB 12.5  --   --   --  11.9*   < > 9.7* 8.5* 8.5* 8.5*  --   --   HCT 35.0*  --   --   --  33.0*   < > 27.6* 25.0* 25.0* 25.0*  --   --   PLT 249  --   --   --  247  --  230  --   --   --   --   --   APTT 65*  --   --   --   --   --   --   --   --   --   --   --   HEPARINUNFRC 0.34  --   --    < >  --    < > <0.10*  --   --   --  0.10* 0.21*  CREATININE 0.87  --   --   --  0.85  --  0.74  --   --   --   --   --   TROPONINIHS  --  12 10  --   --   --   --   --   --   --   --   --    < > = values in this interval not displayed.     Estimated Creatinine Clearance: 140.9 mL/min (by C-G formula based on SCr of 0.74 mg/dL).   Assessment: 36 y.o. female with PE/DVT s/p left VATS and evacuation of hematoma 6/19, for heparin. Heparin restarted post-op with no bolus and conservative dosing (she was on 2350 units/hr pre-op).   -heparin level = 0.21 (subtherapeutic) on 2000 unit/hr -hg= 8.5 -chest tube output= 130 today  Goal of Therapy:  Heparin level 0.3-0.7 units/ml (aiming for 0.3-0.5 per Donata Clay request) Monitor platelets by anticoagulation protocol: Yes   Plan:  Increase Heparin slightly to 2200 units/hr Check heparin level in 8 hours (8 hour interval checks requested by Dr. Donata Clay)  Christoper Fabian, PharmD, BCPS Please see amion  for complete clinical pharmacist phone list 08/04/2021 9:12 PM

## 2021-08-04 NOTE — Progress Notes (Signed)
1 Day Post-Op Procedure(s) (LRB): VIDEO ASSISTED THORACOSCOPY (VATS)/HEMOTHORAX (Left) PAIN PUMP INSERTION (Left) MINI/LIMITED THORACOTOMY (Left) Subjective: Responsive on vent Cxr with good aeration of LLL post decort OR cultures in process Objective: Vital signs in last 24 hours: Temp:  [97.5 F (36.4 C)-98.2 F (36.8 C)] 98 F (36.7 C) (06/20 0700) Pulse Rate:  [68-138] 75 (06/20 0600) Cardiac Rhythm: Normal sinus rhythm;Sinus tachycardia (06/19 2000) Resp:  [14-28] 15 (06/20 0600) BP: (102-174)/(63-108) 113/68 (06/20 0600) SpO2:  [87 %-99 %] 98 % (06/20 0600) Arterial Line BP: (105-191)/(53-94) 126/66 (06/20 0600) FiO2 (%):  [40 %-100 %] 40 % (06/20 0349) Weight:  [115.6 kg] 115.6 kg (06/20 0500)  Hemodynamic parameters for last 24 hours:  nsr  Intake/Output from previous day: 06/19 0701 - 06/20 0700 In: 3202 [I.V.:2291.5; NG/GT:220; IV Piggyback:690.5] Out: 2205 [Urine:1075; Emesis/NG output:40; Chest Tube:290] Intake/Output this shift: No intake/output data recorded.       Exam    General- alert and comfortable    Neck- no JVD, no cervical adenopathy palpable, no carotid bruit   Lungs- clear without rales, wheezes- no air leak from tubes   Cor- regular rate and rhythm, no murmur , gallop   Abdomen- soft, non-tender   Extremities - warm, non-tender, minimal edema   Neuro- oriented, appropriate, no focal weakness   Lab Results: Recent Labs    08/03/21 0140 08/03/21 1051 08/04/21 0346 08/04/21 0351  WBC 18.8*  --  18.9*  --   HGB 11.9*   < > 9.7* 8.5*  HCT 33.0*   < > 27.6* 25.0*  PLT 247  --  230  --    < > = values in this interval not displayed.   BMET:  Recent Labs    08/03/21 0140 08/03/21 1051 08/04/21 0346 08/04/21 0351  NA 134*   < > 136 136  K 3.9   < > 4.3 4.0  CL 103  --  107  --   CO2 23  --  20*  --   GLUCOSE 120*  --  174*  --   BUN 7  --  12  --   CREATININE 0.85  --  0.74  --   CALCIUM 8.1*  --  7.5*  --    < > = values in  this interval not displayed.    PT/INR: No results for input(s): "LABPROT", "INR" in the last 72 hours. ABG    Component Value Date/Time   PHART 7.402 08/04/2021 0351   HCO3 20.2 08/04/2021 0351   TCO2 21 (L) 08/04/2021 0351   ACIDBASEDEF 4.0 (H) 08/04/2021 0351   O2SAT 99 08/04/2021 0351   CBG (last 3)  Recent Labs    08/04/21 0032 08/04/21 0455 08/04/21 0800  GLUCAP 178* 204* 164*    Assessment/Plan: S/P Procedure(s) (LRB): VIDEO ASSISTED THORACOSCOPY (VATS)/HEMOTHORAX (Left) PAIN PUMP INSERTION (Left) MINI/LIMITED THORACOTOMY (Left) Wean and extubate Transition to PCA, mobilize Broad spectrum antibiotics Cautious titration of heparin POD #1   LOS: 5 days    Lovett Sox 08/04/2021

## 2021-08-04 NOTE — Progress Notes (Signed)
ANTICOAGULATION CONSULT NOTE  Pharmacy Consult for Heparin  Indication: pulmonary embolus and RLE DVT Brief A/P: Heparin level subtherapeutic Increase Heparin rate  Allergies  Allergen Reactions   Shellfish Allergy Hives    Itching     Patient Measurements: Height: 6' (182.9 cm) Weight: 115.8 kg (255 lb 4.7 oz) IBW/kg (Calculated) : 73.1 Heparin Dosing Weight: 100 kg  Vital Signs: Temp: 97.8 F (36.6 C) (06/19 2300) Temp Source: Oral (06/19 2300) BP: 117/66 (06/20 0400) Pulse Rate: 98 (06/20 0500)  Labs: Recent Labs    08/01/21 1212 08/01/21 1212 08/01/21 1839 08/02/21 0131 08/02/21 0804 08/02/21 1007 08/02/21 1740 08/03/21 0140 08/03/21 1051 08/03/21 2204 08/04/21 0346 08/04/21 0351  HGB  --    < >  --  12.5  --   --   --  11.9*   < > 10.5* 9.7* 8.5*  HCT  --    < >  --  35.0*  --   --   --  33.0*   < > 28.6* 27.6* 25.0*  PLT  --   --   --  249  --   --   --  247  --   --  230  --   APTT 78*  --  77* 65*  --   --   --   --   --   --   --   --   HEPARINUNFRC 0.49  --   --  0.34  --   --  <0.10*  --   --  <0.10* <0.10*  --   CREATININE  --   --   --  0.87  --   --   --  0.85  --   --  0.74  --   TROPONINIHS  --   --   --   --  12 10  --   --   --   --   --   --    < > = values in this interval not displayed.     Estimated Creatinine Clearance: 141.1 mL/min (by C-G formula based on SCr of 0.74 mg/dL).   Assessment: 36 y.o. female with PE/DVT s/p left VATS and evacuation of hematoma 6/19, for heparin. Heparin restarted post-op with no bolus and conservative dosing (she was on 2350 units/hr pre-op).  Heparin level remains undetectable.  Goal of Therapy:  Heparin level 0.3-0.7 units/ml  Monitor platelets by anticoagulation protocol: Yes   Plan:  Increase Heparin 1800 units/hr Check heparin level in 8 hours.  Geannie Risen, PharmD, BCPS  08/04/2021 5:06 AM

## 2021-08-04 NOTE — Progress Notes (Signed)
ANTICOAGULATION CONSULT NOTE  Pharmacy Consult for Heparin  Indication: pulmonary embolus and RLE DVT  Allergies  Allergen Reactions   Shellfish Allergy Hives    Itching     Patient Measurements: Height: 6' (182.9 cm) Weight: 115.6 kg (254 lb 13.6 oz) IBW/kg (Calculated) : 73.1 Heparin Dosing Weight: 100 kg  Vital Signs: Temp: 98 F (36.7 C) (06/20 0700) Temp Source: Oral (06/20 0400) BP: 113/68 (06/20 0600) Pulse Rate: 79 (06/20 1031)  Labs: Recent Labs     0000 08/01/21 1839 08/02/21 0131 08/02/21 0804 08/02/21 1007 08/02/21 1740 08/03/21 0140 08/03/21 1051 08/03/21 2204 08/04/21 0346 08/04/21 0351 08/04/21 1101 08/04/21 1111  HGB   < >  --  12.5  --   --   --  11.9*   < > 10.5* 9.7* 8.5* 8.5*  --   HCT   < >  --  35.0*  --   --   --  33.0*   < > 28.6* 27.6* 25.0* 25.0*  --   PLT  --   --  249  --   --   --  247  --   --  230  --   --   --   APTT  --  77* 65*  --   --   --   --   --   --   --   --   --   --   HEPARINUNFRC  --   --  0.34  --   --    < >  --   --  <0.10* <0.10*  --   --  0.10*  CREATININE  --   --  0.87  --   --   --  0.85  --   --  0.74  --   --   --   TROPONINIHS  --   --   --  12 10  --   --   --   --   --   --   --   --    < > = values in this interval not displayed.     Estimated Creatinine Clearance: 140.9 mL/min (by C-G formula based on SCr of 0.74 mg/dL).   Assessment: 36 y.o. female with PE/DVT s/p left VATS and evacuation of hematoma 6/19, for heparin. Heparin restarted post-op with no bolus and conservative dosing (she was on 2350 units/hr pre-op).   -heparin level = 0.1 on 1800 unit/hr -hg= 8.5 -chest tube output= 290  Goal of Therapy:  Heparin level 0.3-0.7 units/ml  Monitor platelets by anticoagulation protocol: Yes   Plan:  Increase Heparin to 2000 units/hr Check heparin level in 8 hours.  Harland German, PharmD Clinical Pharmacist **Pharmacist phone directory can now be found on [email or web address] ([email or web address]).  Listed under [facility]  Pharmacy.

## 2021-08-04 NOTE — Progress Notes (Signed)
EVENING ROUNDS NOTE :     [address]       [place] [address]             [phone]                 1 Day Post-Op Procedure(s) (LRB): VIDEO ASSISTED THORACOSCOPY (VATS)/HEMOTHORAX (Left) PAIN PUMP INSERTION (Left) MINI/LIMITED THORACOTOMY (Left)   Total Length of Stay:  LOS: 5 days  Events:   No events Up to chair    BP 135/80   Pulse (!) 113   Temp 98 F (36.7 C)   Resp (!) 24   Ht 6' (1.829 m)   Wt 115.6 kg   LMP 07/30/2021 (Exact Date)   SpO2 98%   BMI 34.56 kg/m      Vent Mode: PSV;CPAP FiO2 (%):  [40 %-60 %] 40 % Set Rate:  [4 bmp-16 bmp] 4 bmp Vt Set:  [580 mL] 580 mL PEEP:  [5 cmH20-10 cmH20] 5 cmH20 Pressure Support:  [10 cmH20] 10 cmH20 Plateau Pressure:  [26 cmH20] 26 cmH20   bupivacaine 0.5 % ON-Q pump SINGLE CATH 300 mL     ceFEPime (MAXIPIME) IV Stopped (08/04/21 1503)   heparin 2,000 Units/hr (08/04/21 1700)   vancomycin Stopped (08/04/21 1302)    I/O last 3 completed shifts: In: 4861.2 [I.V.:3541.5; NG/GT:220; IV Piggyback:1099.7] Out: 2905 [Urine:1775; Emesis/NG output:40; Other:800; Chest Tube:290]      Latest Ref Rng & Units 08/04/2021   11:01 AM 08/04/2021    3:51 AM 08/04/2021    3:46 AM  CBC  WBC 4.0 - 10.5 K/uL   18.9   Hemoglobin 12.0 - 15.0 g/dL 8.5  8.5  9.7   Hematocrit 36.0 - 46.0 % 25.0  25.0  27.6   Platelets 150 - 400 K/uL   230        Latest Ref Rng & Units 08/04/2021   11:01 AM 08/04/2021    3:51 AM 08/04/2021    3:46 AM  BMP  Glucose 70 - 99 mg/dL   132   BUN 6 - 20 mg/dL   12   Creatinine 4.40 - 1.00 mg/dL   1.02   Sodium 725 - 366 mmol/L 136  136  136   Potassium 3.5 - 5.1 mmol/L 3.8  4.0  4.3   Chloride 98 - 111 mmol/L   107   CO2 22 - 32 mmol/L   20   Calcium 8.9 - 10.3 mg/dL   7.5     ABG    Component Value Date/Time   PHART 7.427 08/04/2021 1101   PCO2ART 32.2 08/04/2021 1101   PO2ART 76 (L) 08/04/2021 1101   HCO3 21.3 08/04/2021 1101   TCO2 22 08/04/2021 1101   ACIDBASEDEF  3.0 (H) 08/04/2021 1101   O2SAT 96 08/04/2021 1101       Brynda Greathouse, MD 08/04/2021 6:13 PM

## 2021-08-04 NOTE — Progress Notes (Signed)
NAMEKirandeep Garcia, MRN:  [id number], DOB:  1985-07-03, LOS: 5 ADMISSION DATE:  07/30/2021, CONSULTATION DATE:  6/18 REFERRING MD:  Elgergawy, CHIEF COMPLAINT:  acute pulmonary emboli with persistent chest pain, dyspnea and tachycardia  History of Present Illness:  36 year old female with history as mentioned below. Initially went to her primary care provider's office with chief complaint of about 2-week history of worsening right lower extremity pain and swelling.  Found to have acute deep vein thrombosis, and Xarelto was initiated.  She is an active smoker, she is on birth control form of NuvaRing.  Endorses fairly sedentary lifestyle.  Presented to see the ER 6/15 with chief complaint of fairly significant pleuritic type chest pain, back pain, and shortness of breath.  Was found to be tachycardic on arrival with heart rate in the 100s a CT of chest was obtained showing fairly extensive left-sided pulmonary emboli without evidence of right heart strain there was fairly significant left-sided airspace disease consistent with probable infarct she was started on IV heparin and admitted to the medical ward.  Over the time period from admission on 6/15 through 6/18 she has had persistent ongoing pleuritic type chest pain which has not improved, ongoing tachypnea with shallow respiratory efforts due to pain, and persistent tachycardia which is worsened over the last 48 hours, prior to critical care consult on 6/18 obtained for concern regarding possible worsening symptoms  Pertinent  Medical History  Anxiety, depression, high cholesterol, hand tremor, migraines. Tobacco abuse  Significant Hospital Events: Including procedures, antibiotic start and stop dates in addition to other pertinent events   6/15 admitted with acute left-sided pulmonary emboli with what appears to be left-sided pulmonary infarct, outpatient Ultrasound from [facility] showed right saphenofemoral junction DVT which extended into the common  femoral vein.  Started on heparin 6/16 echocardiogram: LVEF 55% RV normal normal RV systolic function 6/18 pulmonary asked to evaluate for worsening tachycardia, ongoing pain, and tachypnea.  Stat CT showing decreased clot burden but new loculated large right pleural effusion worsening consolidation.  Moved to ICU, 2 L crystalloid challenge administered, lactate sent, IV vancomycin and cefepime initiated, heparin placed on hold chest tube placed 6/19 OR s/p left VATS, left intubated> increased O2/ vent needs  Interim History / Subjective:  Off fentanyl gtt, remains on precedex, doing rapid wean this am.  Down to 40% FiO2  Objective   Blood pressure 113/68, pulse 75, temperature 98 F (36.7 C), resp. rate 15, height 6' (1.829 m), weight 115.6 kg, last menstrual period 07/30/2021, SpO2 98 %.    Vent Mode: PRVC FiO2 (%):  [40 %-100 %] 40 % Set Rate:  [14 bmp-18 bmp] 16 bmp Vt Set:  [500 mL-580 mL] 580 mL PEEP:  [5 cmH20-10 cmH20] 10 cmH20 Plateau Pressure:  [26 cmH20-33 cmH20] 26 cmH20   Intake/Output Summary (Last 24 hours) at 08/04/2021 0825 Last data filed at 08/04/2021 0600 Gross per 24 hour  Intake 3201.98 ml  Output 2205 ml  Net 996.98 ml   Filed Weights   07/30/21 1506 07/31/21 1350 08/04/21 0500  Weight: 112.9 kg 115.8 kg 115.6 kg    Examination: General:  critically ill young adult female intubated in NAD on MV HEENT: MM pink/moist, ETT/ OGT, pupils reactive Neuro: Awake, f/c, MAE CV: rr, NSR 80's, R IJ CVL, left radial aline PULM:  weaning on SIMV, anxious/ tachpneic at times but redirectable, R lung clear, left with faint insp/ exp wheeze in LLL, minimal secretions, left pleural tube x 2 to  sxn, no air leak, 55ml bloody drainage/ 12hrs  GI: obese, soft, bs+, foley  Extremities: warm/dry, no LE edema  Skin: no rashes  Afebrile  UOP 1L/ 24 hrs  Net +2.8L 40ml/ 12hrs out of pleural tubes  Labs reviewed> stable BMET, WBC 18.9. Hgb slow drift 9.7,  heparin levels remains  low  CXR reviewed> stable lines/ tubes, PT remain in place, better aeration left lower lobe  Resolved Hospital Problem list     Assessment & Plan:  Principal Problem:   Acute pulmonary embolism (HCC) Active Problems:   Pulmonary infarction (HCC)   Pleural effusion on right   Sepsis (HCC)   DVT, lower extremity (HCC)   Tachycardia   Chest pain   Anxiety   High cholesterol   Acute hypoxic respiratory failure post procedure  - rapid wean per TCTS for extubation this morning given better oxygenation - VAP/ PPI  - continue precedex as needed for PAD, changing to dilaudid PCA and ongoing ON-q pump to VATS site, with cont multimodal pain management with bowel regimen - aggressive pulm hygiene post extubation  Left empyema/ hemothorax  Plan - s/p left thora per Dr. Donata Clay 6/19 with placement of On-q pain pump - pleural tubes per TCTS - follow pleural cultures and studies - continue vanc/ cefepime for now pending cultures  - pain management as above  Acute RLE DVT and Left Pulmonary emboli w/ associated pulmonary infarct - Provoking factors appear to be smoking, birth control, and sedentary lifestyle - cont heparin per pharmacy and monitor closely for bleeding/ H/H trends   Sepsis secondary to pneumonia with left empyema following pulmonary infarct Plan - continue abx > vanc/ cefepime, narrow as culture data allows - trend WBC/ fever curve - follow cultures   Chest pain secondary to complicated right pleural effusion Plan - multimodal pain management as above   Hyperlipidemia Plan - Continue Crestor per tube for now  Best Practice (right click and "Reselect all SmartList Selections" daily)   Diet/type: NPO, swallow screen post extubation  DVT prophylaxis: systemic heparin GI prophylaxis: PPI Lines: Central line and Arterial Line Foley:  Yes, and it is still needed> likely d/c after extubation Code Status:  full code Last date of multidisciplinary goals of care  discussion [pending ]  Boyfriend at bedside.  Mother, Tracy Garcia updated by phone.   Labs   CBC: Recent Labs  Lab 07/31/21 0345 08/01/21 0408 08/02/21 0131 08/03/21 0140 08/03/21 1051 08/03/21 1350 08/03/21 1535 08/03/21 2204 08/04/21 0346 08/04/21 0351  WBC 23.6* 24.5* 22.7* 18.8*  --   --   --   --  18.9*  --   NEUTROABS 19.8*  --   --   --   --   --   --   --   --   --   HGB 12.9 12.4 12.5 11.9*   < > 10.5* 9.9* 10.5* 9.7* 8.5*  HCT 35.6* 34.0* 35.0* 33.0*   < > 31.0* 29.0* 28.6* 27.6* 25.0*  MCV 89.0 88.1 88.6 89.7  --   --   --   --  89.9  --   PLT 254 223 249 247  --   --   --   --  230  --    < > = values in this interval not displayed.    Basic Metabolic Panel: Recent Labs  Lab 07/30/21 1510 07/31/21 0345 08/02/21 0131 08/03/21 0140 08/03/21 1051 08/03/21 1350 08/03/21 1535 08/04/21 0346 08/04/21 0351  NA 136 132* 133*  134* 134* 134* 134* 136 136  K 3.9 3.9 3.7 3.9 3.8 4.1 4.3 4.3 4.0  CL 103 99 102 103  --   --   --  107  --   CO2 20* 20* 20* 23  --   --   --  20*  --   GLUCOSE 125* 116* 120* 120*  --   --   --  174*  --   BUN 8 10 8 7   --   --   --  12  --   CREATININE 1.01* 0.97 0.87 0.85  --   --   --  0.74  --   CALCIUM 9.6 8.8* 8.4* 8.1*  --   --   --  7.5*  --   MG  --  1.4*  --   --   --   --   --   --   --   PHOS  --  3.3  --   --   --   --   --   --   --    GFR: Estimated Creatinine Clearance: 140.9 mL/min (by C-G formula based on SCr of 0.74 mg/dL). Recent Labs  Lab 07/31/21 0345 08/01/21 0408 08/02/21 0131 08/02/21 1007 08/02/21 1257 08/03/21 0140 08/03/21 1414 08/04/21 0346  PROCALCITON 0.31  --  0.70  --   --  0.54  --   --   WBC 23.6* 24.5* 22.7*  --   --  18.8*  --  18.9*  LATICACIDVEN  --   --  0.7 1.2 1.5  --  0.8  --     Liver Function Tests: Recent Labs  Lab 07/30/21 2145 07/31/21 0345  AST 11* 12*  ALT 12 12  ALKPHOS 53 57  BILITOT 1.2 1.0  PROT 7.3 6.9  ALBUMIN 3.4* 3.1*   No results for input(s): "LIPASE",  "AMYLASE" in the last 168 hours. No results for input(s): "AMMONIA" in the last 168 hours.  ABG    Component Value Date/Time   PHART 7.402 08/04/2021 0351   PCO2ART 32.4 08/04/2021 0351   PO2ART 133 (H) 08/04/2021 0351   HCO3 20.2 08/04/2021 0351   TCO2 21 (L) 08/04/2021 0351   ACIDBASEDEF 4.0 (H) 08/04/2021 0351   O2SAT 99 08/04/2021 0351     Coagulation Profile: Recent Labs  Lab 07/30/21 1930  INR 1.4*    Cardiac Enzymes: No results for input(s): "CKTOTAL", "CKMB", "CKMBINDEX", "TROPONINI" in the last 168 hours.  HbA1C: No results found for: "HGBA1C"  CBG: Recent Labs  Lab 08/03/21 1629 08/03/21 2007 08/04/21 0032 08/04/21 0455 08/04/21 0800  GLUCAP 144* 141* 178* 204* 164*       Critical care time: 32 min      08/06/21, ACNP [facility] 08/04/2021, 8:25 AM  See Amion for pager If no response to pager, please call PCCM consult pager After 7:00 pm call Elink

## 2021-08-04 NOTE — Procedures (Signed)
Extubation Procedure Note  Patient Details:   Name: Tracy Garcia DOB: 1985/12/03 MRN: [id number]   Airway Documentation:    Vent end date: 08/04/21 Vent end time: 0928   Evaluation  O2 sats: stable throughout Complications: No apparent complications Patient did tolerate procedure well. Bilateral Breath Sounds: Clear, Diminished   Yes  Positive cuff leak, NIF -22, VC 1.2L.no stridor noted. Pt on 4L [place]. PT able to reach 375 with the IS.   Kendra Opitz Melvinia Ashby 08/04/2021, 9:37 AM

## 2021-08-05 ENCOUNTER — Inpatient Hospital Stay (HOSPITAL_COMMUNITY): Payer: 59

## 2021-08-05 DIAGNOSIS — J90 Pleural effusion, not elsewhere classified: Secondary | ICD-10-CM

## 2021-08-05 DIAGNOSIS — I824Y1 Acute embolism and thrombosis of unspecified deep veins of right proximal lower extremity: Secondary | ICD-10-CM | POA: Diagnosis not present

## 2021-08-05 DIAGNOSIS — J869 Pyothorax without fistula: Secondary | ICD-10-CM | POA: Diagnosis not present

## 2021-08-05 DIAGNOSIS — I2699 Other pulmonary embolism without acute cor pulmonale: Secondary | ICD-10-CM | POA: Diagnosis not present

## 2021-08-05 LAB — COMPREHENSIVE METABOLIC PANEL
ALT: 16 U/L (ref 0–44)
AST: 21 U/L (ref 15–41)
Albumin: 1.7 g/dL — ABNORMAL LOW (ref 3.5–5.0)
Alkaline Phosphatase: 67 U/L (ref 38–126)
Anion gap: 6 (ref 5–15)
BUN: 10 mg/dL (ref 6–20)
CO2: 23 mmol/L (ref 22–32)
Calcium: 7.5 mg/dL — ABNORMAL LOW (ref 8.9–10.3)
Chloride: 110 mmol/L (ref 98–111)
Creatinine, Ser: 0.66 mg/dL (ref 0.44–1.00)
GFR, Estimated: >60 mL/min (ref >60)
Glucose, Bld: 95 mg/dL (ref 70–99)
Potassium: 3 mmol/L — ABNORMAL LOW (ref 3.5–5.1)
Sodium: 139 mmol/L (ref 135–145)
Total Bilirubin: 0.6 mg/dL (ref 0.3–1.2)
Total Protein: 5.4 g/dL — ABNORMAL LOW (ref 6.5–8.1)

## 2021-08-05 LAB — CBC
HCT: 28.5 % — ABNORMAL LOW (ref 36.0–46.0)
Hemoglobin: 9.6 g/dL — ABNORMAL LOW (ref 12.0–15.0)
MCH: 31 pg (ref 26.0–34.0)
MCHC: 33.7 g/dL (ref 30.0–36.0)
MCV: 91.9 fL (ref 80.0–100.0)
Platelets: 305 10*3/uL (ref 150–400)
RBC: 3.1 MIL/uL — ABNORMAL LOW (ref 3.87–5.11)
RDW: 12.6 % (ref 11.5–15.5)
WBC: 17.9 10*3/uL — ABNORMAL HIGH (ref 4.0–10.5)
nRBC: 0 % (ref 0.0–0.2)

## 2021-08-05 LAB — GLUCOSE, CAPILLARY
Glucose-Capillary: 85 mg/dL (ref 70–99)
Glucose-Capillary: 107 mg/dL — ABNORMAL HIGH (ref 70–99)
Glucose-Capillary: 98 mg/dL (ref 70–99)
Glucose-Capillary: 96 mg/dL (ref 70–99)

## 2021-08-05 LAB — HEPARIN LEVEL (UNFRACTIONATED)
Heparin Unfractionated: 0.37 IU/mL (ref 0.30–0.70)
Heparin Unfractionated: 0.41 IU/mL (ref 0.30–0.70)

## 2021-08-05 LAB — MAGNESIUM: Magnesium: 2.2 mg/dL (ref 1.7–2.4)

## 2021-08-05 MED ORDER — BISACODYL 5 MG PO TBEC: 10.0000 mg | DELAYED_RELEASE_TABLET | Freq: Every day | ORAL | Status: DC | PRN

## 2021-08-05 MED ORDER — KETOROLAC TROMETHAMINE 30 MG/ML IJ SOLN
30.0000 mg | Freq: Four times a day (QID) | INTRAMUSCULAR | Status: DC
  Administered 2021-08-05 – 2021-08-06 (×4): 30 mg via INTRAVENOUS
  Filled 2021-08-05 (×4): qty 1

## 2021-08-05 MED ORDER — NICOTINE 14 MG/24HR TD PT24
14.0000 mg | MEDICATED_PATCH | Freq: Every day | TRANSDERMAL | Status: DC
  Administered 2021-08-05 – 2021-08-10 (×6): 14 mg via TRANSDERMAL
  Filled 2021-08-05 (×6): qty 1

## 2021-08-05 MED ORDER — GUAIFENESIN ER 600 MG PO TB12
600.0000 mg | ORAL_TABLET | Freq: Two times a day (BID) | ORAL | Status: DC
  Administered 2021-08-05 – 2021-08-10 (×11): 600 mg via ORAL
  Filled 2021-08-05 (×11): qty 1

## 2021-08-05 MED ORDER — POTASSIUM CHLORIDE CRYS ER 20 MEQ PO TBCR
20.0000 meq | EXTENDED_RELEASE_TABLET | ORAL | Status: AC
  Administered 2021-08-05 (×3): 20 meq via ORAL
  Filled 2021-08-05 (×3): qty 1

## 2021-08-05 MED ORDER — BOOST PO LIQD
237.0000 mL | Freq: Three times a day (TID) | ORAL | Status: DC
  Administered 2021-08-05 – 2021-08-10 (×11): 237 mL via ORAL
  Filled 2021-08-05 (×18): qty 237

## 2021-08-05 NOTE — Progress Notes (Signed)
Patient's RN consulted for 2 x PIV access for d/c central line. Assess patient's MAR, this patient needs three PIV access due to non-comparable among medications. Especially patient has limited vascular access and there is frequent blood drawn due to heparin drip. Maintain the central line at this time and recommended reassess removing central line or putting in the PICC 2-3 days later. Discussed with patient's RN Baird Lyons) regarding this concerns. HS [employer]

## 2021-08-05 NOTE — Progress Notes (Signed)
ANTICOAGULATION CONSULT NOTE  Pharmacy Consult for Heparin  Indication: pulmonary embolus and RLE DVT  Allergies  Allergen Reactions   Shellfish Allergy Hives    Itching     Patient Measurements: Height: 6' (182.9 cm) Weight: 115.6 kg (254 lb 13.6 oz) IBW/kg (Calculated) : 73.1 Heparin Dosing Weight: 100 kg  Vital Signs: Temp: 97.7 F (36.5 C) (06/20 2308) Temp Source: Oral (06/20 2308) BP: 125/70 (06/21 0500) Pulse Rate: 104 (06/21 0500)  Labs: Recent Labs    08/02/21 0804 08/02/21 1007 08/02/21 1740 08/03/21 0140 08/03/21 1051 08/04/21 0346 08/04/21 0351 08/04/21 0920 08/04/21 1101 08/04/21 1111 08/04/21 2023 08/05/21 0437  HGB  --   --   --  11.9*   < > 9.7*   < > 8.5* 8.5*  --   --  9.6*  HCT  --   --   --  33.0*   < > 27.6*   < > 25.0* 25.0*  --   --  28.5*  PLT  --   --   --  247  --  230  --   --   --   --   --  305  HEPARINUNFRC  --   --    < >  --    < > <0.10*  --   --   --  0.10* 0.21* 0.37  CREATININE  --   --   --  0.85  --  0.74  --   --   --   --   --   --   TROPONINIHS 12 10  --   --   --   --   --   --   --   --   --   --    < > = values in this interval not displayed.     Estimated Creatinine Clearance: 140.9 mL/min (by C-G formula based on SCr of 0.74 mg/dL).   Assessment: 36 y.o. female with PE/DVT s/p left VATS and evacuation of hematoma 6/19, for heparin. Heparin restarted post-op with no bolus and conservative dosing (she was on 2350 units/hr pre-op).   -heparin level = 0.21 (subtherapeutic) on 2000 unit/hr -hg= 8.5 -chest tube output= 130 today  6/21 AM update:  Heparin level is within therapeutic range Hgb 8.5>>9.6  Goal of Therapy:  Heparin level 0.3-0.7 units/ml (aiming for 0.3-0.5 per Donata Clay request) Monitor platelets by anticoagulation protocol: Yes   Plan:  Cont heparin at 2200 units/hr Re-check heparin level in 8 hours  Abran Duke, PharmD, BCPS Clinical Pharmacist Phone: [phone]

## 2021-08-05 NOTE — Progress Notes (Signed)
NAMECylie Garcia, MRN:  [id number], DOB:  [birth year], LOS: 6 ADMISSION DATE:  07/30/2021, CONSULTATION DATE:  6/18 REFERRING MD:  Elgergawy, CHIEF COMPLAINT:  acute pulmonary emboli with persistent chest pain, dyspnea and tachycardia  History of Present Illness:  36 year old female with history as mentioned below. Initially went to her primary care provider's office with chief complaint of about 2-week history of worsening right lower extremity pain and swelling.  Found to have acute deep vein thrombosis, and Xarelto was initiated.  She is an active smoker, she is on birth control form of NuvaRing.  Endorses fairly sedentary lifestyle.  Presented to see the ER 6/15 with chief complaint of fairly significant pleuritic type chest pain, back pain, and shortness of breath.  Was found to be tachycardic on arrival with heart rate in the 100s a CT of chest was obtained showing fairly extensive left-sided pulmonary emboli without evidence of right heart strain there was fairly significant left-sided airspace disease consistent with probable infarct she was started on IV heparin and admitted to the medical ward.  Over the time period from admission on 6/15 through 6/18 she has had persistent ongoing pleuritic type chest pain which has not improved, ongoing tachypnea with shallow respiratory efforts due to pain, and persistent tachycardia which is worsened over the last 48 hours, prior to critical care consult on 6/18 obtained for concern regarding possible worsening symptoms  Pertinent  Medical History  Anxiety, depression, high cholesterol, hand tremor, migraines. Tobacco abuse  Significant Hospital Events: Including procedures, antibiotic start and stop dates in addition to other pertinent events   6/15 admitted with acute left-sided pulmonary emboli with what appears to be left-sided pulmonary infarct, outpatient Ultrasound from [facility] showed right saphenofemoral junction DVT which extended into the common  femoral vein.  Started on heparin 6/16 echocardiogram: LVEF 55% RV normal normal RV systolic function 6/18 pulmonary asked to evaluate for worsening tachycardia, ongoing pain, and tachypnea.  Stat CT showing decreased clot burden but new loculated large right pleural effusion worsening consolidation.  Moved to ICU, 2 L crystalloid challenge administered, lactate sent, IV vancomycin and cefepime initiated, heparin placed on hold chest tube placed 6/19 OR s/p left VATS, left intubated> increased O2/ vent needs 6/20 extubated, started PCA dilaudid, off precedex   Interim History / Subjective:  Up in bedside chair.  RN reports pain at site has been not very well controlled> patient trying to prolong pain meds then gets out of control then difficult to get back manageable.  Remains on PCA dilaudid.   Overall, feeling better  Objective   Blood pressure 126/71, pulse (!) 121, temperature 97.9 F (36.6 C), temperature source Oral, resp. rate (!) 23, height 6' (1.829 m), weight 120.9 kg, last menstrual period 07/30/2021, SpO2 96 %.    FiO2 (%):  [40 %] 40 %   Intake/Output Summary (Last 24 hours) at 08/05/2021 0959 Last data filed at 08/05/2021 0600 Gross per 24 hour  Intake 1530.72 ml  Output 1850 ml  Net -319.28 ml   Filed Weights   07/31/21 1350 08/04/21 0500 08/05/21 0500  Weight: 115.8 kg 115.6 kg 120.9 kg    Examination: General:  Young female sitting in bedside recliner, no distress HEENT: MM pink/moist Neuro:  Awake, oriented, MAE- generally weak CV: rr, ST 105 PULM:  non labored, clear R, diminished left base, left pleural Ctx 2 to 20 sxn, no air leak, minimal drainage GI: soft, bs hypo, NT, voids  Extremities: warm/dry, RLE slight more edematous  than L, no warmth/ tenderness  Skin: no rashes   Afebrile  UOP 1.9L/ 24 hrs + 2unmeasured 160 ml/ 24 hrs out of pleural tubes> bloody, no pus Labs reviewed K 3 Mag 2.2 WBC 18.9> 19.7 Hgb stable 9.6 Heparin level 0.37  CBG range  85- 145  Resolved Hospital Problem list     Assessment & Plan:  Principal Problem:   Acute pulmonary embolism (HCC) Active Problems:   Pulmonary infarction (HCC)   Pleural effusion on right   Sepsis (HCC)   DVT, lower extremity (HCC)   Tachycardia   Chest pain   Anxiety   High cholesterol   Acute hypoxic respiratory failure post procedure  - extubated 6/20 - continue to wean supplemental O2 for sat goal > 92% - doing well.  Need to work on aggressive pulm hygiene> IS, flutter, deep breathing/ coughing exercises> limited given ongoing site soreness, PT ordered - remains hemodynamically and respiratory stable for transfer to PCU today   Left empyema/ hemothorax  Plan - s/p left thora per Dr. Donata Clay 6/19 with placement of On-q pain pump - pleural tubes per T[facility] - follow pleural cultures and studies - continue vanc/ cefepime  - multimodal pain management> ON-Q pump, PCA dilaudid, tylenol scheduled, oxy IRtoradol added 6/21 w/ bowel regimen> senokot, miralax, reglan, colace   Acute RLE DVT and Left Pulmonary emboli w/ associated pulmonary infarct - Provoking factors appear to be smoking, birth control, and sedentary lifestyle - cont heparin per pharmacy and monitor closely for bleeding.  No evidence thus far.  H/H remain stable - will need transition to eventual DOAC prior to d/c  Sepsis secondary to pneumonia with left empyema following pulmonary infarct Plan - continue abx > vanc/ cefepime, narrow as culture data allows - trend WBC/ fever curve - follow cultures  - try and get CVL out today   Chest pain secondary to complicated right pleural effusion Plan - multimodal pain management as above   Hyperlipidemia Plan - Continue Crestor per tube for now  Anxiety - prn xanax - melatonin prn qHS sleep  Tobacco abuse - reports withdrawal symptoms.  Adding nicotine patch.  Ongoing smoking cessation  Hypokalemia - KCL replete.  Mag ok at 2.2  At risk for  malnutrition - regular diet, cont ensure  Best Practice (right click and "Reselect all SmartList Selections" daily)   Diet/type: Regular consistency (see orders)  DVT prophylaxis: systemic heparin GI prophylaxis: PPI Lines: Central line> may be able to d/c if can get additional PIV Foley:  N/A Code Status:  full code Last date of multidisciplinary goals of care discussion [pending ]  Patient updated on plan of care.  Pending transfer to 2C/ PCU.  TRH will pick up 6/22, PCCM available then as needed.    Labs   CBC: Recent Labs  Lab 07/31/21 0345 08/01/21 0408 08/02/21 0131 08/03/21 0140 08/03/21 1051 08/04/21 0346 08/04/21 0351 08/04/21 0920 08/04/21 1101 08/05/21 0437  WBC 23.6* 24.5* 22.7* 18.8*  --  18.9*  --   --   --  17.9*  NEUTROABS 19.8*  --   --   --   --   --   --   --   --   --   HGB 12.9 12.4 12.5 11.9*   < > 9.7* 8.5* 8.5* 8.5* 9.6*  HCT 35.6* 34.0* 35.0* 33.0*   < > 27.6* 25.0* 25.0* 25.0* 28.5*  MCV 89.0 88.1 88.6 89.7  --  89.9  --   --   --  91.9  PLT 254 223 249 247  --  230  --   --   --  305   < > = values in this interval not displayed.    Basic Metabolic Panel: Recent Labs  Lab 07/31/21 0345 08/02/21 0131 08/03/21 0140 08/03/21 1051 08/04/21 0346 08/04/21 0351 08/04/21 0920 08/04/21 1101 08/05/21 0437  NA 132* 133* 134*   < > 136 136 135 136 139  K 3.9 3.7 3.9   < > 4.3 4.0 3.9 3.8 3.0*  CL 99 102 103  --  107  --   --   --  110  CO2 20* 20* 23  --  20*  --   --   --  23  GLUCOSE 116* 120* 120*  --  174*  --   --   --  95  BUN 10 8 7   --  12  --   --   --  10  CREATININE 0.97 0.87 0.85  --  0.74  --   --   --  0.66  CALCIUM 8.8* 8.4* 8.1*  --  7.5*  --   --   --  7.5*  MG 1.4*  --   --   --   --   --   --   --  2.2  PHOS 3.3  --   --   --   --   --   --   --   --    < > = values in this interval not displayed.   GFR: Estimated Creatinine Clearance: 144.2 mL/min (by C-G formula based on SCr of 0.66 mg/dL). Recent Labs  Lab  07/31/21 0345 08/01/21 0408 08/02/21 0131 08/02/21 1007 08/02/21 1257 08/03/21 0140 08/03/21 1414 08/04/21 0346 08/05/21 0437  PROCALCITON 0.31  --  0.70  --   --  0.54  --   --   --   WBC 23.6*   < > 22.7*  --   --  18.8*  --  18.9* 17.9*  LATICACIDVEN  --   --  0.7 1.2 1.5  --  0.8  --   --    < > = values in this interval not displayed.    Liver Function Tests: Recent Labs  Lab 07/30/21 2145 07/31/21 0345 08/05/21 0437  AST 11* 12* 21  ALT 12 12 16   ALKPHOS 53 57 67  BILITOT 1.2 1.0 0.6  PROT 7.3 6.9 5.4*  ALBUMIN 3.4* 3.1* 1.7*   No results for input(s): "LIPASE", "AMYLASE" in the last 168 hours. No results for input(s): "AMMONIA" in the last 168 hours.  ABG    Component Value Date/Time   PHART 7.427 08/04/2021 1101   PCO2ART 32.2 08/04/2021 1101   PO2ART 76 (L) 08/04/2021 1101   HCO3 21.3 08/04/2021 1101   TCO2 22 08/04/2021 1101   ACIDBASEDEF 3.0 (H) 08/04/2021 1101   O2SAT 96 08/04/2021 1101     Coagulation Profile: Recent Labs  Lab 07/30/21 1930  INR 1.4*    Cardiac Enzymes: No results for input(s): "CKTOTAL", "CKMB", "CKMBINDEX", "TROPONINI" in the last 168 hours.  HbA1C: No results found for: "HGBA1C"  CBG: Recent Labs  Lab 08/04/21 1551 08/04/21 1951 08/04/21 2319 08/05/21 0436 08/05/21 0653  GLUCAP 103* 119* 145* 85 107*       Critical care time: n/a     08/07/21, ACNP [facility] 08/05/2021, 9:59 AM  See Amion for pager If no response to pager, please  call PCCM consult pager After 7:00 pm call Elink

## 2021-08-05 NOTE — Progress Notes (Signed)
CT surgery PM rounds  Patient has been more comfortable as the day progressed Up in chair and ambulating in room Heparin is now dosed at 2200 units/h, chest tube output is minimal with sanguinous fluid.  We will be conservative about removing chest tube since the patient is on IV heparin.  Transfer orders in place for unit 2 C Chest x-ray pending in a.m.

## 2021-08-05 NOTE — Progress Notes (Signed)
Pharmacy Antibiotic Note  Tracy Garcia is a 36 y.o. female admitted on 07/30/2021 with chest pain and shortness of breath, diagnosed with acute PE.  Recently diagnosed with RLE DVT on 6/14 day before admission.   Today 08/02/21, pharmacy has been consulted for vancomycin and Cefepime dosing for pneumonia/ HCAP.  Plan: Cefepime 2 g IV q8h Vancomycin 1250 mg IV Q12 hrs. Goal AUC 400-550.Expected AUC: 471 SCr used 0.87, Vd 0.5 L/kg d/t BMI >30. Monitor clinical status, renal function and culture results daily.  Check vanc levels per protocol  Height: 6' (182.9 cm) Weight: 120.9 kg (266 lb 9.6 oz) IBW/kg (Calculated) : 73.1  Temp (24hrs), Avg:97.8 F (36.6 C), Min:97.7 F (36.5 C), Max:97.9 F (36.6 C)  Recent Labs  Lab 07/31/21 0345 08/01/21 0408 08/02/21 0131 08/02/21 1007 08/02/21 1257 08/03/21 0140 08/03/21 1414 08/04/21 0346 08/05/21 0437  WBC 23.6* 24.5* 22.7*  --   --  18.8*  --  18.9* 17.9*  CREATININE 0.97  --  0.87  --   --  0.85  --  0.74 0.66  LATICACIDVEN  --   --  0.7 1.2 1.5  --  0.8  --   --      Estimated Creatinine Clearance: 144.2 mL/min (by C-G formula based on SCr of 0.66 mg/dL).    Allergies  Allergen Reactions   Shellfish Allergy Hives    Itching     Antimicrobials this admission:  Vancomycin  6/18>> Cefepime 6/18>>>   Dose adjustments this admission: N/a  Microbiology results: 6/19 pleural fluid - ngtd 6/18 MRSA PCR- neg  Thank you for allowing pharmacy to be a part of this patient's care.  Reece Leader, Colon Flattery, BCCP Clinical Pharmacist  08/05/2021 9:01 AM   [facility] pharmacy phone numbers are listed on [email or web address]

## 2021-08-05 NOTE — Progress Notes (Addendum)
TCTS DAILY ICU PROGRESS NOTE                   [address]            [place] [address]          [phone]   2 Days Post-Op Procedure(s) (LRB): VIDEO ASSISTED THORACOSCOPY (VATS)/HEMOTHORAX (Left) PAIN PUMP INSERTION (Left) MINI/LIMITED THORACOTOMY (Left)  Total Length of Stay:  LOS: 6 days   Subjective: Patient sitting in chair this am. She has incisional/left chest tube pain. She is coughing "stuff up" but hard to do so.  Objective: Vital signs in last 24 hours: Temp:  [97.7 F (36.5 C)-97.9 F (36.6 C)] 97.9 F (36.6 C) (06/21 0650) Pulse Rate:  [76-117] 108 (06/21 0700) Cardiac Rhythm: Normal sinus rhythm;Sinus tachycardia (06/21 0400) Resp:  [14-37] 21 (06/21 0700) BP: (109-152)/(56-85) 129/72 (06/21 0700) SpO2:  [93 %-99 %] 98 % (06/21 0700) Arterial Line BP: (110-174)/(59-78) 174/78 (06/20 1600) FiO2 (%):  [40 %] 40 % (06/20 1952) Weight:  [120.9 kg] 120.9 kg (06/21 0500)  Filed Weights   07/31/21 1350 08/04/21 0500 08/05/21 0500  Weight: 115.8 kg 115.6 kg 120.9 kg    Weight change: 5.329 kg     Intake/Output from previous day: 06/20 0701 - 06/21 0700 In: 1530.7 [P.O.:120; I.V.:577.3; IV Piggyback:833.5] Out: 2065 [Urine:1905; Chest Tube:160]  Intake/Output this shift: No intake/output data recorded.  Current Meds: Scheduled Meds:  acetaminophen  1,000 mg Oral Q6H   bisacodyl  10 mg Oral Daily   Chlorhexidine Gluconate Cloth  6 each Topical Daily   docusate sodium  100 mg Oral BID   HYDROmorphone   Intravenous Q4H   insulin aspart  0-15 Units Subcutaneous Q4H   levalbuterol  1.25 mg Nebulization Q6H   metoCLOPramide (REGLAN) injection  10 mg Intravenous Q6H   pantoprazole  40 mg Oral Daily   polyethylene glycol  17 g Oral Daily   potassium chloride  20 mEq Oral Q4H   rosuvastatin  20 mg Oral QHS   senna-docusate  1 tablet Oral QHS   Continuous Infusions:  bupivacaine 0.5 % ON-Q pump SINGLE CATH 300 mL     ceFEPime  (MAXIPIME) IV Stopped (08/05/21 0545)   heparin 2,200 Units/hr (08/05/21 0600)   vancomycin Stopped (08/05/21 0034)   PRN Meds:.ALPRAZolam, benzonatate, diphenhydrAMINE **OR** diphenhydrAMINE, melatonin, midazolam, naloxone **AND** sodium chloride flush, ondansetron (ZOFRAN) IV, oxyCODONE, polyethylene glycol  General appearance: alert, cooperative, and no distress Heart: Tachycardic Lungs: Slightly diminished left base;right lung is clear Abdomen: Soft, obese, sporadic bowel sounds Extremities: No calf tenderness Wound: Clean and dry  Lab Results: CBC: Recent Labs    08/04/21 0346 08/04/21 0351 08/04/21 1101 08/05/21 0437  WBC 18.9*  --   --  17.9*  HGB 9.7*   < > 8.5* 9.6*  HCT 27.6*   < > 25.0* 28.5*  PLT 230  --   --  305   < > = values in this interval not displayed.   BMET:  Recent Labs    08/04/21 0346 08/04/21 0351 08/04/21 1101 08/05/21 0437  NA 136   < > 136 139  K 4.3   < > 3.8 3.0*  CL 107  --   --  110  CO2 20*  --   --  23  GLUCOSE 174*  --   --  95  BUN 12  --   --  10  CREATININE 0.74  --   --  0.66  CALCIUM 7.5*  --   --  7.5*   < > = values in this interval not displayed.    CMET: Lab Results  Component Value Date   WBC 17.9 (H) 08/05/2021   HGB 9.6 (L) 08/05/2021   HCT 28.5 (L) 08/05/2021   PLT 305 08/05/2021   GLUCOSE 95 08/05/2021   ALT 16 08/05/2021   AST 21 08/05/2021   NA 139 08/05/2021   K 3.0 (L) 08/05/2021   CL 110 08/05/2021   CREATININE 0.66 08/05/2021   BUN 10 08/05/2021   CO2 23 08/05/2021   TSH 3.108 08/02/2021   INR 1.4 (H) 07/30/2021    PT/INR: No results for input(s): "LABPROT", "INR" in the last 72 hours. Radiology: No results found.   Assessment/Plan: S/P Procedure(s) (LRB): VIDEO ASSISTED THORACOSCOPY (VATS)/HEMOTHORAX (Left) PAIN PUMP INSERTION (Left) MINI/LIMITED THORACOTOMY (Left) CV-ST.  Pulmonary-on 3 liters of oxygen via [place]. Chest tubes with 160 cc. Chest tubes are to suction. CXR this am appears  stable. Hope to remove 1 chest tube soon. Mucinex bid. Encourage incentive spirometer and flutter valve. DVT/PE-Heparin drip. 4. Regarding pain control, PCA, Oxy PRN. Will give a few doses of scheduled Toradol 5. ID-Leukocytosis WBC 17,900. OR gram stain shows no organisms and no growth to date. On Cefepime and Vancoymcin 6. CBGs 145/85/107. No history of diabetes. Stop accu checks and SS PRN 7. Anemia-H and H this am stable at 9.6 and 28.5 8. Supplement potassium 9. Would like to transfer her to [facility] Margaretann Loveless PA-C 08/05/2021 7:31 AM  Leave chest tubes in due to high dose heparin, tx 2C  patient examined and medical record reviewed,agree with above note. Lovett Sox 08/05/2021

## 2021-08-05 NOTE — Progress Notes (Signed)
ANTICOAGULATION CONSULT NOTE  Pharmacy Consult for Heparin  Indication: pulmonary embolus and RLE DVT  Allergies  Allergen Reactions   Shellfish Allergy Hives    Itching     Patient Measurements: Height: 6' (182.9 cm) Weight: 120.9 kg (266 lb 9.6 oz) IBW/kg (Calculated) : 73.1 Heparin Dosing Weight: 100 kg  Vital Signs: Temp: 98 F (36.7 C) (06/21 1114) Temp Source: Oral (06/21 1114) BP: 135/76 (06/21 1300) Pulse Rate: 119 (06/21 1300)  Labs: Recent Labs    08/03/21 0140 08/03/21 1051 08/04/21 0346 08/04/21 0351 08/04/21 0920 08/04/21 1101 08/04/21 1111 08/04/21 2023 08/05/21 0437 08/05/21 1300  HGB 11.9*   < > 9.7*   < > 8.5* 8.5*  --   --  9.6*  --   HCT 33.0*   < > 27.6*   < > 25.0* 25.0*  --   --  28.5*  --   PLT 247  --  230  --   --   --   --   --  305  --   HEPARINUNFRC  --    < > <0.10*  --   --   --    < > 0.21* 0.37 0.41  CREATININE 0.85  --  0.74  --   --   --   --   --  0.66  --    < > = values in this interval not displayed.     Estimated Creatinine Clearance: 144.2 mL/min (by C-G formula based on SCr of 0.66 mg/dL).   Assessment: 36 y.o. female with PE/DVT s/p left VATS and evacuation of hematoma 6/19, for heparin. Heparin restarted post-op with no bolus and conservative dosing (she was on 2350 units/hr pre-op).   -heparin level = 0.41, at goal on 2200 units/hr of IV heparin -Hg= 9.6; Pltc stable -chest tube output= 160 yesterday, not charted yet today  Goal of Therapy:  Heparin level 0.3-0.7 units/ml (aiming for 0.3-0.5 per Donata Clay request) Monitor platelets by anticoagulation protocol: Yes   Plan:  Cont heparin at 2200 units/hr Daily heparin level and CBC. F/u plans for oral   Reece Leader, Colon Flattery, [facility] Clinical Pharmacist  08/05/2021 2:17 PM   [facility] pharmacy phone numbers are listed on [email or web address]

## 2021-08-05 NOTE — Progress Notes (Signed)
Instructed pt on flutter valve due to congested cough. Pt c/o pain when coughing, RT gave pt pillow for splinting when coughing

## 2021-08-05 NOTE — Evaluation (Addendum)
Physical Therapy Evaluation Patient Details Name: Tracy Garcia MRN: [id number] DOB: 05-15-1985 Today's Date: 08/05/2021  History of Present Illness  Pt is a 36 y.o. female with recent dx RLE DVT (07/29/21), now admitted 07/30/21 with chest pain, SOB; workup for acute PE, L empyema/hemothorax. S/p L VATS, mini L thoracotomy 6/19. Other PMH includes anxiety, depression, bilateral hand tremors.   Clinical Impression  Pt presents with an overall decrease in functional mobility secondary to above. 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 Initiated educ re: precautions, positioning, activity recommendations and importance of mobility. Today, pt able to initiate transfer and gait training with eva walker; pt requiring min guard to modA for mobility. Expect pt to progress well once pain better controlled. Pt would benefit from continued acute PT services to maximize functional mobility and independence prior to d/c home.     HR up to 144 with ambulation    Recommendations for follow up therapy are one component of a multi-disciplinary discharge planning process, led by the attending physician.  Recommendations may be updated based on patient status, additional functional criteria and insurance authorization.  Follow Up Recommendations No PT follow up      Assistance Recommended at Discharge Intermittent Supervision/Assistance  Patient can return home with the following  A little help with bathing/dressing/bathroom;Assistance with cooking/housework;Assist for transportation;Help with stairs or ramp for entrance    Equipment Recommendations  (TBD)  Recommendations for Other Services  OT consult    Functional Status Assessment Patient has had a recent decline in their functional status and demonstrates the ability to make significant improvements in function in a reasonable and predictable amount of time.     Precautions /  Restrictions Precautions Precautions: Fall;Other (comment) Precaution Comments: PCA, chest tube; watch HR, SpO2 (does not wear O2 baseline) Restrictions Weight Bearing Restrictions: No      Mobility  Bed Mobility Overal bed mobility: Needs Assistance Bed Mobility: Sit to Supine       Sit to supine: Mod assist   General bed mobility comments: ModA for BLE management return to supine    Transfers Overall transfer level: Needs assistance Equipment used: None Transfers: Sit to/from Stand Sit to Stand: Min guard           General transfer comment: cues for hand placement, increased effort secondary to pain    Ambulation/Gait Ambulation/Gait assistance: Min guard Gait Distance (Feet): 180 Feet Assistive device: Fara Boros Gait Pattern/deviations: Step-to pattern, Step-through pattern, Decreased stride length Gait velocity: Decreased     General Gait Details: slow, guarded gait with eva walker and min guard for balance; intermittent cues for pursed lip breathing, pt requesting O2 [place] turned on, unable to get reliable pulse ox reading; ambulated on 2-3L O2 [place], HR up to 144  Stairs            Wheelchair Mobility    Modified Rankin (Stroke Patients Only)       Balance Overall balance assessment: Needs assistance Sitting-balance support: No upper extremity supported, Feet supported Sitting balance-Leahy Scale: Fair     Standing balance support: No upper extremity supported, During functional activity Standing balance-Leahy Scale: Fair Standing balance comment: can static stand without UE support, preference for walker support                             Pertinent Vitals/Pain Pain Assessment Pain Assessment: Faces Faces Pain Scale:  Hurts little more Pain Location: L-side abdomen (thoracotomy incision) Pain Descriptors / Indicators: Discomfort, Grimacing, Guarding Pain Intervention(s): Monitored during session, PCA encouraged    Home Living  Family/patient expects to be discharged to:: Private residence Living Arrangements: Spouse/significant other (fiance) Available Help at Discharge: Family;Available 24 hours/day Type of Home: House Home Access: Stairs to enter;Level entry       Home Layout: One level Home Equipment: Wheelchair - manual;Crutches Additional Comments: Lives with fiance in basement below mother, has level entry down driveway or flight of stairs from main level.    Prior Function Prior Level of Function : Independent/Modified Independent             Mobility Comments: Typically indep without DME, most recently using single crutch due to RLE pain with DVT. Pt helps care for mother who lives upstairs, helps care for 6 dogs in house. Fiance does driving, available to assist as needed.       Hand Dominance        Extremity/Trunk Assessment   Upper Extremity Assessment Upper Extremity Assessment: Generalized weakness (BUE tremors noted)    Lower Extremity Assessment Lower Extremity Assessment: Generalized weakness       Communication   Communication: No difficulties  Cognition Arousal/Alertness: Awake/alert Behavior During Therapy: WFL for tasks assessed/performed, Flat affect Overall Cognitive Status: Within Functional Limits for tasks assessed                                          General Comments General comments (skin integrity, edema, etc.): pt's fiance Thayer Ohm) present and supportive. pt requesting supplemental oxygen due to SOB, difficulty getting reliable pulse ox reading; ambulated on 2-3L O2 [place] (RN aware); SpO2 96% upon return to room, HR up to 144. increased time discussing importance of upright mobility, activity recommendations (3-5x/day hallway ambulation), abdominal/thoracotomy precautions (splinting to cough, etc.), incentive spirometer and flutter valve use    Exercises Other Exercises Other Exercises: incentive spirometer x5 (pt pulling < ), flutter  valve x5   Assessment/Plan    PT Assessment Patient needs continued PT services  PT Problem List Decreased strength;Decreased activity tolerance;Decreased balance;Decreased mobility;Decreased knowledge of use of DME;Decreased knowledge of precautions;Cardiopulmonary status limiting activity;Pain       PT Treatment Interventions DME instruction;Gait training;Stair training;Functional mobility training;Therapeutic activities;Therapeutic exercise;Balance training;Patient/family education    PT Goals (Current goals can be found in the Care Plan section)  Acute Rehab PT Goals Patient Stated Goal: return home PT Goal Formulation: With patient Time For Goal Achievement: 08/19/21 Potential to Achieve Goals: Good    Frequency Min 3X/week     Co-evaluation               AM-PAC PT "6 Clicks" Mobility  Outcome Measure Help needed turning from your back to your side while in a flat bed without using bedrails?: A Lot Help needed moving from lying on your back to sitting on the side of a flat bed without using bedrails?: A Lot Help needed moving to and from a bed to a chair (including a wheelchair)?: A Little Help needed standing up from a chair using your arms (e.g., wheelchair or bedside chair)?: A Little Help needed to walk in hospital room?: A Little Help needed climbing 3-5 steps with a railing? : A Little 6 Click Score: 16    End of Session Equipment Utilized During Treatment: Oxygen Activity Tolerance: Patient  tolerated treatment well;Patient limited by pain Patient left: in bed;with call bell/phone within reach;with nursing/sitter in room;with family/visitor present (return to bed for IV team) Nurse Communication: Mobility status PT Visit Diagnosis: Other abnormalities of gait and mobility (R26.89);Pain    Time: [id number] PT Time Calculation (min) (ACUTE ONLY): 30 min   Charges:   PT Evaluation $PT Eval Moderate Complexity: 1 Mod PT Treatments $Therapeutic Activity:  8-22 mins      Mabeline Caras, PT, DPT Acute Rehabilitation Services  Pager [phone] Office [phone]  Derry Lory 08/05/2021, 12:22 PM

## 2021-08-06 ENCOUNTER — Inpatient Hospital Stay (HOSPITAL_COMMUNITY): Payer: 59

## 2021-08-06 DIAGNOSIS — R071 Chest pain on breathing: Secondary | ICD-10-CM

## 2021-08-06 DIAGNOSIS — R000 Tachycardia, unspecified: Secondary | ICD-10-CM

## 2021-08-06 DIAGNOSIS — F419 Anxiety disorder, unspecified: Secondary | ICD-10-CM

## 2021-08-06 DIAGNOSIS — R6520 Severe sepsis without septic shock: Secondary | ICD-10-CM

## 2021-08-06 DIAGNOSIS — E7800 Pure hypercholesterolemia, unspecified: Secondary | ICD-10-CM

## 2021-08-06 DIAGNOSIS — I824Y1 Acute embolism and thrombosis of unspecified deep veins of right proximal lower extremity: Secondary | ICD-10-CM | POA: Diagnosis not present

## 2021-08-06 DIAGNOSIS — J9601 Acute respiratory failure with hypoxia: Secondary | ICD-10-CM

## 2021-08-06 DIAGNOSIS — A419 Sepsis, unspecified organism: Secondary | ICD-10-CM

## 2021-08-06 DIAGNOSIS — I2699 Other pulmonary embolism without acute cor pulmonale: Secondary | ICD-10-CM | POA: Diagnosis not present

## 2021-08-06 LAB — BASIC METABOLIC PANEL
Anion gap: 12 (ref 5–15)
BUN: 9 mg/dL (ref 6–20)
CO2: 19 mmol/L — ABNORMAL LOW (ref 22–32)
Calcium: 7.6 mg/dL — ABNORMAL LOW (ref 8.9–10.3)
Chloride: 108 mmol/L (ref 98–111)
Creatinine, Ser: 0.7 mg/dL (ref 0.44–1.00)
GFR, Estimated: >60 mL/min (ref >60)
Glucose, Bld: 117 mg/dL — ABNORMAL HIGH (ref 70–99)
Potassium: 3.3 mmol/L — ABNORMAL LOW (ref 3.5–5.1)
Sodium: 139 mmol/L (ref 135–145)

## 2021-08-06 LAB — BPAM RBC
Blood Product Expiration Date: 202307062359
Blood Product Expiration Date: 202307062359
Blood Product Expiration Date: 202307062359
Blood Product Expiration Date: 202307062359
ISSUE DATE / TIME: 202306191030
ISSUE DATE / TIME: 202306191030
Unit Type and Rh: 6200
Unit Type and Rh: 6200
Unit Type and Rh: 6200
Unit Type and Rh: 6200

## 2021-08-06 LAB — CBC
HCT: 20.5 % — ABNORMAL LOW (ref 36.0–46.0)
HCT: 35.6 % — ABNORMAL LOW (ref 36.0–46.0)
Hemoglobin: 7.1 g/dL — ABNORMAL LOW (ref 12.0–15.0)
Hemoglobin: 12.1 g/dL (ref 12.0–15.0)
MCH: 32 pg (ref 26.0–34.0)
MCH: 30.2 pg (ref 26.0–34.0)
MCHC: 34.6 g/dL (ref 30.0–36.0)
MCHC: 34 g/dL (ref 30.0–36.0)
MCV: 92.3 fL (ref 80.0–100.0)
MCV: 88.8 fL (ref 80.0–100.0)
Platelets: 348 10*3/uL (ref 150–400)
Platelets: 331 10*3/uL (ref 150–400)
RBC: 2.22 MIL/uL — ABNORMAL LOW (ref 3.87–5.11)
RBC: 4.01 MIL/uL (ref 3.87–5.11)
RDW: 12.9 % (ref 11.5–15.5)
RDW: 13.3 % (ref 11.5–15.5)
WBC: 18.1 10*3/uL — ABNORMAL HIGH (ref 4.0–10.5)
WBC: 14.2 10*3/uL — ABNORMAL HIGH (ref 4.0–10.5)
nRBC: 0 % (ref 0.0–0.2)
nRBC: 0.2 % (ref 0.0–0.2)

## 2021-08-06 LAB — TYPE AND SCREEN
ABO/RH(D): A POS
Antibody Screen: NEGATIVE
Unit division: 0
Unit division: 0
Unit division: 0
Unit division: 0

## 2021-08-06 LAB — HEPARIN LEVEL (UNFRACTIONATED)
Heparin Unfractionated: 0.21 IU/mL — ABNORMAL LOW (ref 0.30–0.70)
Heparin Unfractionated: 0.31 IU/mL (ref 0.30–0.70)

## 2021-08-06 MED ORDER — HEPARIN (PORCINE) 25000 UT/250ML-% IV SOLN
2150.0000 [IU]/h | INTRAVENOUS | Status: DC
  Administered 2021-08-06 (×2): 2000 [IU]/h via INTRAVENOUS
  Administered 2021-08-07: 2100 [IU]/h via INTRAVENOUS
  Administered 2021-08-07: 2000 [IU]/h via INTRAVENOUS
  Administered 2021-08-08 – 2021-08-09 (×3): 2200 [IU]/h via INTRAVENOUS
  Administered 2021-08-09: 2150 [IU]/h via INTRAVENOUS
  Filled 2021-08-06 (×7): qty 250

## 2021-08-06 MED ORDER — KETOROLAC TROMETHAMINE 30 MG/ML IJ SOLN
30.0000 mg | Freq: Four times a day (QID) | INTRAMUSCULAR | Status: DC
  Administered 2021-08-06 – 2021-08-07 (×5): 30 mg via INTRAVENOUS
  Filled 2021-08-06 (×5): qty 1

## 2021-08-06 MED ORDER — POTASSIUM CHLORIDE CRYS ER 20 MEQ PO TBCR
20.0000 meq | EXTENDED_RELEASE_TABLET | ORAL | Status: AC
Start: 2021-08-06 — End: 2021-08-06
  Administered 2021-08-06 (×3): 20 meq via ORAL
  Filled 2021-08-06 (×2): qty 1

## 2021-08-06 MED ORDER — FUROSEMIDE 10 MG/ML IJ SOLN
40.0000 mg | Freq: Every day | INTRAMUSCULAR | Status: DC
  Administered 2021-08-06 – 2021-08-10 (×5): 40 mg via INTRAVENOUS
  Filled 2021-08-06 (×6): qty 4

## 2021-08-06 NOTE — Progress Notes (Addendum)
[facility] DAILY ICU PROGRESS NOTE                   [address]            [place] [address]          [phone]   3 Days Post-Op Procedure(s) (LRB): VIDEO ASSISTED THORACOSCOPY (VATS)/HEMOTHORAX (Left) PAIN PUMP INSERTION (Left) MINI/LIMITED THORACOTOMY (Left)  Total Length of Stay:  LOS: 7 days   Subjective: Patient in bed, awake and alert. Toradol helped with her pain. She did walk yesterday  Objective: Vital signs in last 24 hours: Temp:  [97.9 F (36.6 C)-98.4 F (36.9 C)] 98 F (36.7 C) (06/22 0400) Pulse Rate:  [88-131] 109 (06/22 0700) Cardiac Rhythm: Sinus tachycardia (06/21 2000) Resp:  [16-28] 19 (06/22 0700) BP: (111-148)/(53-84) 119/74 (06/22 0700) SpO2:  [89 %-99 %] 94 % (06/22 0700) FiO2 (%):  [21 %] 21 % (06/22 0441) Weight:  [122 kg] 122 kg (06/22 0600)  Filed Weights   08/04/21 0500 08/05/21 0500 08/06/21 0600  Weight: 115.6 kg 120.9 kg 122 kg    Weight change: 1.071 kg     Intake/Output from previous day: 06/21 0701 - 06/22 0700 In: 1457.3 [P.O.:120; I.V.:545.4; IV Piggyback:792] Out: 860 [Urine:750; Chest Tube:110]  Intake/Output this shift: No intake/output data recorded.  Current Meds: Scheduled Meds:  acetaminophen  1,000 mg Oral Q6H   Chlorhexidine Gluconate Cloth  6 each Topical Daily   docusate sodium  100 mg Oral BID   guaiFENesin  600 mg Oral BID   HYDROmorphone   Intravenous Q4H   ketorolac  30 mg Intravenous Q6H   lactose free nutrition  237 mL Oral TID WC   levalbuterol  1.25 mg Nebulization Q6H   metoCLOPramide (REGLAN) injection  10 mg Intravenous Q6H   nicotine  14 mg Transdermal Daily   pantoprazole  40 mg Oral Daily   polyethylene glycol  17 g Oral Daily   rosuvastatin  20 mg Oral QHS   senna-docusate  1 tablet Oral QHS   Continuous Infusions:  bupivacaine 0.5 % ON-Q pump SINGLE CATH 300 mL     ceFEPime (MAXIPIME) IV 200 mL/hr at 08/06/21 0700   heparin 2,200 Units/hr (08/06/21 0700)   vancomycin  Stopped (08/06/21 0201)   PRN Meds:.ALPRAZolam, benzonatate, bisacodyl, diphenhydrAMINE **OR** diphenhydrAMINE, melatonin, midazolam, naloxone **AND** sodium chloride flush, ondansetron (ZOFRAN) IV, oxyCODONE, polyethylene glycol  General appearance: alert, cooperative, and no distress Heart: Tachycardic Lungs: Slightly diminished left base;right lung is clear Abdomen: Soft, obese, sporadic bowel sounds Extremities: No calf tenderness, bilateral feet edema Wound: Clean and dry  Lab Results: CBC: Recent Labs    08/04/21 0346 08/04/21 0351 08/04/21 1101 08/05/21 0437  WBC 18.9*  --   --  17.9*  HGB 9.7*   < > 8.5* 9.6*  HCT 27.6*   < > 25.0* 28.5*  PLT 230  --   --  305   < > = values in this interval not displayed.    BMET:  Recent Labs    08/04/21 0346 08/04/21 0351 08/04/21 1101 08/05/21 0437  NA 136   < > 136 139  K 4.3   < > 3.8 3.0*  CL 107  --   --  110  CO2 20*  --   --  23  GLUCOSE 174*  --   --  95  BUN 12  --   --  10  CREATININE 0.74  --   --  0.66  CALCIUM 7.5*  --   --  7.5*   < > = values in this interval not displayed.     CMET: Lab Results  Component Value Date   WBC 17.9 (H) 08/05/2021   HGB 9.6 (L) 08/05/2021   HCT 28.5 (L) 08/05/2021   PLT 305 08/05/2021   GLUCOSE 95 08/05/2021   ALT 16 08/05/2021   AST 21 08/05/2021   NA 139 08/05/2021   K 3.0 (L) 08/05/2021   CL 110 08/05/2021   CREATININE 0.66 08/05/2021   BUN 10 08/05/2021   CO2 23 08/05/2021   TSH 3.108 08/02/2021   INR 1.4 (H) 07/30/2021    PT/INR: No results for input(s): "LABPROT", "INR" in the last 72 hours. Radiology: No results found.   Assessment/Plan: S/P Procedure(s) (LRB): VIDEO ASSISTED THORACOSCOPY (VATS)/HEMOTHORAX (Left) PAIN PUMP INSERTION (Left) MINI/LIMITED THORACOTOMY (Left) CV-ST.  Pulmonary-on 3 liters of oxygen via [place]. Chest tubes with 110 cc. Chest tubes are to suction. CXR this am appears stable. Hope to remove 1 chest tube soon. Mucinex bid.  Encourage incentive spirometer and flutter valve. DVT/PE-Heparin drip. 4. Regarding pain control, PCA, scheduled Toradol, Oxy PRN 5. ID-Leukocytosis WBC this am 18,100. OR gram stain shows no organisms and no growth to date. On Cefepime and Vancomycin 7. Anemia-H and H this am decreased to 7.1 and 20.5. Transfuse 8. Transfer to [facility] when bed available  Donielle Margaretann Loveless PA-C 08/06/2021 7:12 AM   Chest x-ray with mild pleural thickening following decortication left lower lobe Significant drop in hemoglobin over 24 hours requiring transfusions so we will reduce heparin dose slightly for treatment of PE Recommend continuing Coumadin while patient in hospital for easier control of anticoagulation but at discharge can be put back on preadmission Xarelto dose.  Vitals:   08/06/21 1430 08/06/21 1445  BP: 121/69 125/76  Pulse: 84 70  Resp: 18 19  Temp:    SpO2: 96% 95%   patient examined and medical record reviewed,agree with above note. Lovett Sox 08/06/2021

## 2021-08-06 NOTE — Plan of Care (Signed)

## 2021-08-06 NOTE — Progress Notes (Signed)
PROGRESS NOTE    Tracy Garcia  [id number] DOB: [birth year] DOA: 07/30/2021 PCP: Danelle Berry, NP   Brief Narrative:  36 year old female with history as mentioned below.  Evaluated at PCP for right lower extremity pain edema and swelling with acute DVT placed on Xarelto.  Markedly sedentary lifestyle, tobacco abuse ongoing.  Presented to the ED on 07/30/2021 for pleuritic chest pain, back pain, and shortness of breath.  In the ED patient was found to have PE without evidence of heart strain and left-sided airspace disease concerning for infarct initiated on IV heparin transitioned to hospital services.  6/15 admitted with acute left-sided pulmonary emboli with what appears to be left-sided pulmonary infarct, outpatient Ultrasound from [facility] showed right saphenofemoral junction DVT which extended into the common femoral vein.  Started on heparin drip 6/16 echocardiogram: LVEF 55% RV normal normal RV systolic function [id number] pulmonary asked to evaluate for worsening tachycardia, ongoing pain, and tachypnea. Stat CT showing decreased clot burden but new loculated large right pleural effusion worsening consolidation.  Moved to ICU, 2 L crystalloid challenge administered, lactate sent, IV vancomycin and cefepime initiated, heparin placed on hold chest tube placed 6/19 OR s/p left VATS, left intubated> increased O2/ vent needs 6/20 extubated, started PCA dilaudid, off precedex  6/21-22 -continue to tolerate narcotics, off PCA pump titrating OxyContin as necessary.  Chest tube per CT surgery likely be removed in the next few days pending symptoms drainage and tidal volumes.  Assessment & Plan:   Principal Problem:   Acute pulmonary embolism (HCC) Active Problems:   Pulmonary infarction (HCC)   DVT, lower extremity (HCC)   Tachycardia   Chest pain   Pleural effusion on right   Sepsis ([place])   Anxiety   High cholesterol   Acute hypoxic respiratory failure secondary to left empyema/ hemothorax   Complicated by mechanical ventilation and post procedure respiratory failure, resolved - s/p left thora per Dr. Prescott Gum 6/19  - Continue oxycodone -discontinue Dilaudid PCA  - pleural tube/drain per TCTS - drainage appears to be stable if not minimally downtrending - follow pleural cultures and studies - continue vanc/ cefepime   Acute RLE DVT and Left Pulmonary emboli w/ associated pulmonary infarct - Provoking factors appear to be smoking, birth control, and sedentary lifestyle - cont heparin per pharmacy and monitor closely for bleeding.  No evidence thus far.  H/H remain stable - will need transition to eventual DOAC prior to d/c   Sepsis secondary to pneumonia with left empyema following pulmonary infarct Plan - continue abx > vanc/ cefepime, narrow as culture data allows - trend WBC/ fever curve - follow cultures  - try and get CVL out today     Chest pain secondary to complicated right pleural effusion Plan - multimodal pain management as above    Hyperlipidemia Plan - Continue Crestor per tube for now   Anxiety - prn xanax - melatonin prn qHS sleep   Tobacco abuse - reports withdrawal symptoms.  Adding nicotine patch.  Ongoing smoking cessation   Hypokalemia - KCL replete.  Mag ok at 2.2   At risk for malnutrition - regular diet, cont ensure  DVT prophylaxis: Heparin drip Code Status: Full Family Communication: None present  Status is: Inpatient  Dispo: The patient is from: Home              Anticipated d/c is to: To be determined              Anticipated d/c date is:  72+ hours              Patient currently not medically stable for discharge  Consultants:  Cardiothoracic surgery, PCCM  Antimicrobials:  Cefepime, vancomycin  Subjective: No acute issues or events overnight tolerating early ambulation quite well, chest pain at tube site downtrending otherwise tolerating p.o. without nausea vomiting diarrhea constipation headache fevers chills or  shortness of breath  Objective: Vitals:   08/06/21 0441 08/06/21 0500 08/06/21 0600 08/06/21 0700  BP:  121/80 132/80 119/74  Pulse:  88 97 (!) 109  Resp: 20 (!) 21 19 19   Temp:      TempSrc:      SpO2: 94% 95% 94% 94%  Weight:   122 kg   Height:        Intake/Output Summary (Last 24 hours) at 08/06/2021 0732 Last data filed at 08/06/2021 0700 Gross per 24 hour  Intake 1457.32 ml  Output 860 ml  Net 597.32 ml   Filed Weights   08/04/21 0500 08/05/21 0500 08/06/21 0600  Weight: 115.6 kg 120.9 kg 122 kg    Examination:  General:  Pleasantly resting in bed, No acute distress. HEENT:  Normocephalic atraumatic.  Sclerae nonicteric, noninjected.  Extraocular movements intact bilaterally. Neck: Right IJ catheter bandage clean dry intact. Lungs: Coarse breath sounds bilaterally, left-sided chest tube intact without overt leak Heart:  Regular rate and rhythm.  Without murmurs, rubs, or gallops. Abdomen:  Soft, nontender, nondistended.  Without guarding or rebound. Extremities: Without cyanosis, clubbing, edema, or obvious deformity. Vascular:  Dorsalis pedis and posterior tibial pulses palpable bilaterally. Skin:  Warm and dry, no erythema, no ulcerations.   Data Reviewed: I have personally reviewed following labs and imaging studies  CBC: Recent Labs  Lab 07/31/21 0345 08/01/21 0408 08/02/21 0131 08/03/21 0140 08/03/21 1051 08/04/21 0346 08/04/21 0351 08/04/21 0920 08/04/21 1101 08/05/21 0437 08/06/21 0622  WBC 23.6*   < > 22.7* 18.8*  --  18.9*  --   --   --  17.9* 18.1*  NEUTROABS 19.8*  --   --   --   --   --   --   --   --   --   --   HGB 12.9   < > 12.5 11.9*   < > 9.7* 8.5* 8.5* 8.5* 9.6* 7.1*  HCT 35.6*   < > 35.0* 33.0*   < > 27.6* 25.0* 25.0* 25.0* 28.5* 20.5*  MCV 89.0   < > 88.6 89.7  --  89.9  --   --   --  91.9 92.3  PLT 254   < > 249 247  --  230  --   --   --  305 348   < > = values in this interval not displayed.   Basic Metabolic Panel: Recent  Labs  Lab 07/31/21 0345 08/02/21 0131 08/03/21 0140 08/03/21 1051 08/04/21 0346 08/04/21 0351 08/04/21 0920 08/04/21 1101 08/05/21 0437  NA 132* 133* 134*   < > 136 136 135 136 139  K 3.9 3.7 3.9   < > 4.3 4.0 3.9 3.8 3.0*  CL 99 102 103  --  107  --   --   --  110  CO2 20* 20* 23  --  20*  --   --   --  23  GLUCOSE 116* 120* 120*  --  174*  --   --   --  95  BUN 10 8 7   --  12  --   --   --  10  CREATININE 0.97 0.87 0.85  --  0.74  --   --   --  0.66  CALCIUM 8.8* 8.4* 8.1*  --  7.5*  --   --   --  7.5*  MG 1.4*  --   --   --   --   --   --   --  2.2  PHOS 3.3  --   --   --   --   --   --   --   --    < > = values in this interval not displayed.   GFR: Estimated Creatinine Clearance: 145 mL/min (by C-G formula based on SCr of 0.66 mg/dL). Liver Function Tests: Recent Labs  Lab 07/30/21 2145 07/31/21 0345 08/05/21 0437  AST 11* 12* 21  ALT 12 12 16   ALKPHOS 53 57 67  BILITOT 1.2 1.0 0.6  PROT 7.3 6.9 5.4*  ALBUMIN 3.4* 3.1* 1.7*   No results for input(s): "LIPASE", "AMYLASE" in the last 168 hours. No results for input(s): "AMMONIA" in the last 168 hours. Coagulation Profile: Recent Labs  Lab 07/30/21 1930  INR 1.4*   Cardiac Enzymes: No results for input(s): "CKTOTAL", "CKMB", "CKMBINDEX", "TROPONINI" in the last 168 hours. BNP (last 3 results) No results for input(s): "PROBNP" in the last 8760 hours. HbA1C: No results for input(s): "HGBA1C" in the last 72 hours. CBG: Recent Labs  Lab 08/04/21 2319 08/05/21 0436 08/05/21 0653 08/05/21 1116 08/05/21 1608  GLUCAP 145* 85 107* 98 96   Lipid Profile: No results for input(s): "CHOL", "HDL", "LDLCALC", "TRIG", "CHOLHDL", "LDLDIRECT" in the last 72 hours. Thyroid Function Tests: No results for input(s): "TSH", "T4TOTAL", "FREET4", "T3FREE", "THYROIDAB" in the last 72 hours. Anemia Panel: No results for input(s): "VITAMINB12", "FOLATE", "FERRITIN", "TIBC", "IRON", "RETICCTPCT" in the last 72 hours. Sepsis  Labs: Recent Labs  Lab 07/31/21 0345 08/02/21 0131 08/02/21 1007 08/02/21 1257 08/03/21 0140 08/03/21 1414  PROCALCITON 0.31 0.70  --   --  0.54  --   LATICACIDVEN  --  0.7 1.2 1.5  --  0.8    Recent Results (from the past 240 hour(s))  MRSA Next Gen by PCR, Nasal     Status: None   Collection Time: 08/02/21 10:57 AM   Specimen: Nasal Mucosa; Nasal Swab  Result Value Ref Range Status   MRSA by PCR Next Gen NOT DETECTED NOT DETECTED Final    Comment: (NOTE) The GeneXpert MRSA Assay (FDA approved for NASAL specimens only), is one component of a comprehensive MRSA colonization surveillance program. It is not intended to diagnose MRSA infection nor to guide or monitor treatment for MRSA infections. Test performance is not FDA approved in patients less than 32 years old. Performed at [facility], [address], [place], [place]   Surgical pcr screen     Status: None   Collection Time: 08/02/21 11:54 PM   Specimen: Nasal Mucosa; Nasal Swab  Result Value Ref Range Status   MRSA, PCR NEGATIVE NEGATIVE Final   Staphylococcus aureus NEGATIVE NEGATIVE Final    Comment: (NOTE) The Xpert SA Assay (FDA approved for NASAL specimens in patients 70 years of age and older), is one component of a comprehensive surveillance program. It is not intended to diagnose infection nor to guide or monitor treatment. Performed at [facility], [address] [place], [place]   Aerobic/Anaerobic Culture w Gram Stain (surgical/deep wound)     Status: None (Preliminary result)  Collection Time: 08/03/21 10:20 AM   Specimen: PATH Other; Body Fluid  Result Value Ref Range Status   Specimen Description PLEURAL LEFT  Final   Special Requests A  Final   Gram Stain NO WBC SEEN NO ORGANISMS SEEN   Final   Culture   Final    NO GROWTH 2 DAYS Performed at [facility], [address] [place], [place] [address]    Report Status PENDING  Incomplete   Aerobic/Anaerobic Culture w Gram Stain (surgical/deep wound)     Status: None (Preliminary result)   Collection Time: 08/03/21 10:45 AM   Specimen: PATH Other; Tissue  Result Value Ref Range Status   Specimen Description PLEURAL LEFT PEEL  Final   Special Requests B  Final   Gram Stain NO WBC SEEN NO ORGANISMS SEEN   Final   Culture   Final    NO GROWTH 2 DAYS Performed at [facility], [place] [address] [place], [place] [address]    Report Status PENDING  Incomplete         Radiology Studies: DG Chest Port 1 View  Addendum Date: 08/05/2021   ADDENDUM REPORT: 08/05/2021 10:03 ADDENDUM: Impression #2 called by telephone at the time of interpretation on 08/05/2021 at 8:42 am to provider Dr. Tacy Learn, who verbally acknowledged these results. Electronically Signed   By: Kellie Simmering D.O.   On: 08/05/2021 10:03   Result Date: 08/05/2021 CLINICAL DATA:  Provided history: Post VATS, acute PE. EXAM: PORTABLE CHEST 1 VIEW COMPARISON:  Prior chest radiograph 08/04/2021 and earlier. CT angiogram chest 08/02/2021. FINDINGS: Interval extubation. Interval removal of a previously demonstrated enteric tube. Unchanged position of a right IJ approach central venous catheter with tip projecting at the at the level of the lower SVC. Unchanged position of 2 left-sided chest tubes. Stable cardiomediastinal silhouette. Irregular opacities within the left mid to lower lung field, which may reflect atelectasis and/or consolidation, slightly increased. Small left pleural effusion. Trace left pneumothorax, new from the prior exam. No appreciable airspace consolidation within the right lung. IMPRESSION: Interval extubation and enteric tube removal. Small left apical pneumothorax, new from prior exams (in the presence 2 left-sided chest tubes). Irregular opacities within the left mid-to-lower lung field have increased, and may reflect atelectasis and/or consolidation. Persistent small left pleural effusion.  Electronically Signed: By: Kellie Simmering D.O. On: 08/05/2021 08:28        Scheduled Meds:  acetaminophen  1,000 mg Oral Q6H   Chlorhexidine Gluconate Cloth  6 each Topical Daily   docusate sodium  100 mg Oral BID   guaiFENesin  600 mg Oral BID   HYDROmorphone   Intravenous Q4H   ketorolac  30 mg Intravenous Q6H   lactose free nutrition  237 mL Oral TID WC   levalbuterol  1.25 mg Nebulization Q6H   metoCLOPramide (REGLAN) injection  10 mg Intravenous Q6H   nicotine  14 mg Transdermal Daily   pantoprazole  40 mg Oral Daily   polyethylene glycol  17 g Oral Daily   rosuvastatin  20 mg Oral QHS   senna-docusate  1 tablet Oral QHS   Continuous Infusions:  bupivacaine 0.5 % ON-Q pump SINGLE CATH 300 mL     ceFEPime (MAXIPIME) IV 200 mL/hr at 08/06/21 0700   heparin 2,200 Units/hr (08/06/21 0700)   vancomycin Stopped (08/06/21 0201)     LOS: 7 days   Time spent: 35min  Nashalie Sallis C Georgann Bramble, DO [facility]  If 7PM-7AM, please contact  night-coverage [email or web address]  08/06/2021, 7:32 AM

## 2021-08-06 NOTE — Progress Notes (Signed)
ANTICOAGULATION CONSULT NOTE  Pharmacy Consult for Heparin  Indication: pulmonary embolus and RLE DVT  Allergies  Allergen Reactions   Shellfish Allergy Hives    Itching     Patient Measurements: Height: 6' (182.9 cm) Weight: 122 kg (268 lb 15.4 oz) IBW/kg (Calculated) : 73.1 Heparin Dosing Weight: 100 kg  Vital Signs: Temp: 98.1 F (36.7 C) (06/22 0800) Temp Source: Oral (06/22 0800) BP: 119/74 (06/22 0700) Pulse Rate: 97 (06/22 0913)  Labs: Recent Labs    08/04/21 0346 08/04/21 0351 08/04/21 1101 08/04/21 1111 08/05/21 0437 08/05/21 1300 08/06/21 0622 08/06/21 0907  HGB 9.7*   < > 8.5*  --  9.6*  --  7.1*  --   HCT 27.6*   < > 25.0*  --  28.5*  --  20.5*  --   PLT 230  --   --   --  305  --  348  --   HEPARINUNFRC <0.10*  --   --    < > 0.37 0.41 0.21* 0.31  CREATININE 0.74  --   --   --  0.66  --  0.70  --    < > = values in this interval not displayed.     Estimated Creatinine Clearance: 145 mL/min (by C-G formula based on SCr of 0.7 mg/dL).   Assessment: 36 y.o. female with PE/DVT s/p left VATS and evacuation of hematoma 6/19, for heparin. Heparin restarted post-op with no bolus and conservative dosing (she was on 2350 units/hr pre-op).   -heparin level = 0.31, at goal on 2000 units/hr of IV heparin -Hg= 9.6> 7.1; PRBC ordered -chest tube output= 110 yesterday  Goal of Therapy:  Heparin level 0.3-0.7 units/ml (aiming for 0.3-0.5 per Donata Clay request) Monitor platelets by anticoagulation protocol: Yes   Plan:  Cont heparin at 2000 units/hr Daily heparin level and CBC. F/u plans for oral   Harland German, PharmD Clinical Pharmacist **Pharmacist phone directory can now be found on [email or web address] ([email or web address]).  Listed under [facility].

## 2021-08-06 NOTE — Progress Notes (Signed)
Physical Therapy Treatment Patient Details Name: Tracy Garcia MRN: [id number] DOB: 03-05-1985 Today's Date: 08/06/2021   History of Present Illness Pt is a 36 y.o. female with recent dx RLE DVT (07/29/21), now admitted 07/30/21 with chest pain, SOB; workup for acute PE, L empyema/hemothorax. S/p L VATS, mini L thoracotomy 6/19. Other PMH includes anxiety, depression, bilateral hand tremors.    PT Comments    Pt pleasant and eager to get OOB. Pt remains with incisional pain with all mobility but very willing to try. Pt able to progress gait to RW with need for 1L supplemental O2 to maintain sats during mobility. Encouraged OOB to chair for meals and up to bathroom to toilet. Will continue to follow with education for HEP provided.   HR 101-142 SpO2 95% on RA at rest, 92% on 1L with hall ambulation    Recommendations for follow up therapy are one component of a multi-disciplinary discharge planning process, led by the attending physician.  Recommendations may be updated based on patient status, additional functional criteria and insurance authorization.  Follow Up Recommendations  No PT follow up     Assistance Recommended at Discharge Intermittent Supervision/Assistance  Patient can return home with the following A little help with bathing/dressing/bathroom;Assistance with cooking/housework;Assist for transportation;Help with stairs or ramp for entrance   Equipment Recommendations  None recommended by PT    Recommendations for Other Services       Precautions / Restrictions Precautions Precautions: Fall;Other (comment) Precaution Comments: PCA, chest tube; watch HR, SpO2 (does not wear O2 baseline), on Q pump     Mobility  Bed Mobility Overal bed mobility: Needs Assistance Bed Mobility: Supine to Sit     Supine to sit: Min guard, HOB elevated     General bed mobility comments: HOB 20 degrees with cues and assist for line management with guarding for safety exiting to right of  bed    Transfers Overall transfer level: Needs assistance   Transfers: Sit to/from Stand Sit to Stand: Min guard           General transfer comment: cues for hand placement    Ambulation/Gait Ambulation/Gait assistance: Min guard Gait Distance (Feet): 200 Feet Assistive device: Rolling walker (2 wheels) Gait Pattern/deviations: Step-through pattern, Decreased stride length   Gait velocity interpretation: <1.8 ft/sec, indicate of risk for recurrent falls   General Gait Details: one standing rest with reliance on RW and required 1L to maintain sats >90% with HR max 142 with gait with decreased with standing rest and cues for breathing technique   Stairs             Wheelchair Mobility    Modified Rankin (Stroke Patients Only)       Balance Overall balance assessment: Needs assistance Sitting-balance support: No upper extremity supported, Feet supported Sitting balance-Leahy Scale: Fair Sitting balance - Comments: eOB and toilet   Standing balance support: Bilateral upper extremity supported Standing balance-Leahy Scale: Fair Standing balance comment: RW with gait can static stand without support                            Cognition Arousal/Alertness: Awake/alert Behavior During Therapy: WFL for tasks assessed/performed Overall Cognitive Status: Within Functional Limits for tasks assessed  Exercises General Exercises - Lower Extremity Long Arc Quad: AROM, Both, Seated, 10 reps Hip ABduction/ADduction: AROM, Both, 20 reps, Seated Hip Flexion/Marching: AROM, Both, 20 reps, Seated    General Comments        Pertinent Vitals/Pain Pain Assessment Pain Score: 7  Pain Location: L-side abdomen (thoracotomy incision) Pain Descriptors / Indicators: Discomfort, Grimacing, Guarding Pain Intervention(s): Limited activity within patient's tolerance, Monitored during session, PCA encouraged,  Repositioned    Home Living                          Prior Function            PT Goals (current goals can now be found in the care plan section) Progress towards PT goals: Progressing toward goals    Frequency    Min 3X/week      PT Plan Current plan remains appropriate    Co-evaluation              AM-PAC PT "6 Clicks" Mobility   Outcome Measure  Help needed turning from your back to your side while in a flat bed without using bedrails?: A Little Help needed moving from lying on your back to sitting on the side of a flat bed without using bedrails?: A Little Help needed moving to and from a bed to a chair (including a wheelchair)?: A Little Help needed standing up from a chair using your arms (e.g., wheelchair or bedside chair)?: A Little Help needed to walk in hospital room?: A Little Help needed climbing 3-5 steps with a railing? : A Lot 6 Click Score: 17    End of Session Equipment Utilized During Treatment: Oxygen Activity Tolerance: Patient tolerated treatment well;Patient limited by pain Patient left: in chair;with call bell/phone within reach Nurse Communication: Mobility status PT Visit Diagnosis: Other abnormalities of gait and mobility (R26.89);Pain     Time: 6720-9470 PT Time Calculation (min) (ACUTE ONLY): 39 min  Charges:  $Gait Training: 8-22 mins $Therapeutic Exercise: 8-22 mins $Therapeutic Activity: 8-22 mins                     Merryl Hacker, PT Acute Rehabilitation Services Office: [phone]    Enedina Finner Shanaia Sievers 08/06/2021, 8:14 AM

## 2021-08-06 NOTE — Evaluation (Signed)
Occupational Therapy Evaluation Patient Details Name: Tracy Garcia MRN: [id number] DOB: April 13, 1985 Today's Date: 08/06/2021   History of Present Illness Pt is a 36 y.o. female with recent dx RLE DVT (07/29/21), now admitted 07/30/21 with chest pain, SOB; workup for acute PE, L empyema/hemothorax. S/p L VATS, mini L thoracotomy 6/19. Other PMH includes anxiety, depression, bilateral hand tremors.   Clinical Impression   Patient admitted for the diagnosis above.  PTA she lives with her SO and helps to care for her family member.  Post surgical discomfort is the primary deficit.  OT will follow in the acute setting with no post acute OT anticipated.        Recommendations for follow up therapy are one component of a multi-disciplinary discharge planning process, led by the attending physician.  Recommendations may be updated based on patient status, additional functional criteria and insurance authorization.   Follow Up Recommendations  No OT follow up    Assistance Recommended at Discharge PRN  Patient can return home with the following Assistance with cooking/housework;Assist for transportation    Functional Status Assessment  Patient has had a recent decline in their functional status and demonstrates the ability to make significant improvements in function in a reasonable and predictable amount of time.  Equipment Recommendations  None recommended by OT    Recommendations for Other Services       Precautions / Restrictions Precautions Precautions: Fall Precaution Comments: PCA, chest tube; watch HR, SpO2 Restrictions Weight Bearing Restrictions: No      Mobility Bed Mobility               General bed mobility comments: sitting EOB    Transfers Overall transfer level: Needs assistance Equipment used: None Transfers: Sit to/from Stand Sit to Stand: Supervision                  Balance Overall balance assessment: Needs assistance Sitting-balance support: No  upper extremity supported, Feet supported Sitting balance-Leahy Scale: Good     Standing balance support: Single extremity supported Standing balance-Leahy Scale: Fair                             ADL either performed or assessed with clinical judgement   ADL       Grooming: Supervision/safety;Sitting           Upper Body Dressing : Supervision/safety;Sitting   Lower Body Dressing: Supervision/safety;Sit to/from stand   Toilet Transfer: Retail banker;Ambulation                   Vision Patient Visual Report: No change from baseline       Perception Perception Perception: Not tested   Praxis Praxis Praxis: Not tested    Pertinent Vitals/Pain Pain Assessment Pain Assessment: Faces Faces Pain Scale: Hurts a little bit Pain Location: L-side abdomen (thoracotomy incision) Pain Descriptors / Indicators: Discomfort, Grimacing, Guarding Pain Intervention(s): Monitored during session     Hand Dominance Right   Extremity/Trunk Assessment Upper Extremity Assessment Upper Extremity Assessment: Overall WFL for tasks assessed   Lower Extremity Assessment Lower Extremity Assessment: Defer to PT evaluation   Cervical / Trunk Assessment Cervical / Trunk Assessment: Normal   Communication Communication Communication: No difficulties   Cognition Arousal/Alertness: Awake/alert Behavior During Therapy: WFL for tasks assessed/performed Overall Cognitive Status: Within Functional Limits for tasks assessed  General Comments   HR to 140    Exercises     Shoulder Instructions      Home Living Family/patient expects to be discharged to:: Private residence Living Arrangements: Spouse/significant other Available Help at Discharge: Family;Available 24 hours/day Type of Home: House Home Access: Stairs to enter;Level entry     Home Layout: One level     Bathroom Shower/Tub:  Chief Strategy Officer: Standard     Home Equipment: Wheelchair - manual;Crutches   Additional Comments: Lives with fiance in basement below mother, has level entry down driveway or flight of stairs from main level.      Prior Functioning/Environment Prior Level of Function : Independent/Modified Independent               ADLs Comments: No asssit at PLOF for ADL/iADL        OT Problem List: Decreased activity tolerance;Impaired balance (sitting and/or standing)      OT Treatment/Interventions: Self-care/ADL training;Therapeutic activities;Balance training;Patient/family education    OT Goals(Current goals can be found in the care plan section) Acute Rehab OT Goals Patient Stated Goal: return home OT Goal Formulation: With patient Time For Goal Achievement: 08/20/21 Potential to Achieve Goals: Good ADL Goals Pt Will Perform Grooming: Independently;standing;sitting Pt Will Perform Lower Body Dressing: Independently;sit to/from stand Pt Will Transfer to Toilet: Independently;ambulating;regular height toilet  OT Frequency: Min 2X/week    Co-evaluation              AM-PAC OT "6 Clicks" Daily Activity     Outcome Measure Help from another person eating meals?: None Help from another person taking care of personal grooming?: None Help from another person toileting, which includes using toliet, bedpan, or urinal?: A Little Help from another person bathing (including washing, rinsing, drying)?: A Little Help from another person to put on and taking off regular upper body clothing?: A Little Help from another person to put on and taking off regular lower body clothing?: A Little 6 Click Score: 20   End of Session Nurse Communication: Mobility status  Activity Tolerance: Patient tolerated treatment well Patient left: in chair;with call bell/phone within reach  OT Visit Diagnosis: Unsteadiness on feet (R26.81);Pain Pain - part of body: Leg                 Time: 1715-1733 OT Time Calculation (min): 18 min Charges:  OT General Charges $OT Visit: 1 Visit OT Evaluation $OT Eval Moderate Complexity: 1 Mod  08/06/2021  RP, OTR/L  Acute Rehabilitation Services  Office:  [phone]   Tracy Garcia 08/06/2021, 5:38 PM

## 2021-08-07 ENCOUNTER — Inpatient Hospital Stay (HOSPITAL_COMMUNITY): Payer: 59

## 2021-08-07 DIAGNOSIS — J9601 Acute respiratory failure with hypoxia: Secondary | ICD-10-CM

## 2021-08-07 DIAGNOSIS — I2699 Other pulmonary embolism without acute cor pulmonale: Secondary | ICD-10-CM | POA: Diagnosis not present

## 2021-08-07 DIAGNOSIS — Z720 Tobacco use: Secondary | ICD-10-CM

## 2021-08-07 LAB — CBC
HCT: 34 % — ABNORMAL LOW (ref 36.0–46.0)
Hemoglobin: 11.9 g/dL — ABNORMAL LOW (ref 12.0–15.0)
MCH: 30.9 pg (ref 26.0–34.0)
MCHC: 35 g/dL (ref 30.0–36.0)
MCV: 88.3 fL (ref 80.0–100.0)
Platelets: 301 10*3/uL (ref 150–400)
RBC: 3.85 MIL/uL — ABNORMAL LOW (ref 3.87–5.11)
RDW: 13.6 % (ref 11.5–15.5)
WBC: 13.5 10*3/uL — ABNORMAL HIGH (ref 4.0–10.5)
nRBC: 0 % (ref 0.0–0.2)

## 2021-08-07 LAB — BPAM RBC
Blood Product Expiration Date: 202307152359
Blood Product Expiration Date: 202307152359
ISSUE DATE / TIME: 202306221000
ISSUE DATE / TIME: 202306221000
Unit Type and Rh: 6200
Unit Type and Rh: 6200

## 2021-08-07 LAB — BASIC METABOLIC PANEL
Anion gap: 6 (ref 5–15)
BUN: 8 mg/dL (ref 6–20)
CO2: 20 mmol/L — ABNORMAL LOW (ref 22–32)
Calcium: 7.6 mg/dL — ABNORMAL LOW (ref 8.9–10.3)
Chloride: 112 mmol/L — ABNORMAL HIGH (ref 98–111)
Creatinine, Ser: 0.61 mg/dL (ref 0.44–1.00)
GFR, Estimated: >60 mL/min (ref >60)
Glucose, Bld: 106 mg/dL — ABNORMAL HIGH (ref 70–99)
Potassium: 3.9 mmol/L (ref 3.5–5.1)
Sodium: 138 mmol/L (ref 135–145)

## 2021-08-07 LAB — TYPE AND SCREEN
ABO/RH(D): A POS
Antibody Screen: NEGATIVE
Unit division: 0
Unit division: 0

## 2021-08-07 LAB — HEPARIN LEVEL (UNFRACTIONATED)
Heparin Unfractionated: 0.21 IU/mL — ABNORMAL LOW (ref 0.30–0.70)
Heparin Unfractionated: 0.29 IU/mL — ABNORMAL LOW (ref 0.30–0.70)

## 2021-08-07 MED ORDER — KETOROLAC TROMETHAMINE 30 MG/ML IJ SOLN
30.0000 mg | Freq: Four times a day (QID) | INTRAMUSCULAR | Status: AC
  Administered 2021-08-07 – 2021-08-09 (×8): 30 mg via INTRAVENOUS
  Filled 2021-08-07 (×8): qty 1

## 2021-08-07 MED ORDER — SODIUM CHLORIDE 0.9% FLUSH
10.0000 mL | INTRAVENOUS | Status: DC | PRN
  Administered 2021-08-08: 10 mL

## 2021-08-07 MED ORDER — POTASSIUM CHLORIDE CRYS ER 20 MEQ PO TBCR
20.0000 meq | EXTENDED_RELEASE_TABLET | Freq: Once | ORAL | Status: AC
Start: 2021-08-08 — End: 2021-08-08
  Administered 2021-08-08: 20 meq via ORAL
  Filled 2021-08-07: qty 1

## 2021-08-07 MED ORDER — SODIUM CHLORIDE 0.9% FLUSH
10.0000 mL | Freq: Two times a day (BID) | INTRAVENOUS | Status: DC
  Administered 2021-08-08 – 2021-08-10 (×6): 10 mL

## 2021-08-07 NOTE — TOC Progression Note (Signed)
Transition of Care [facility]) - Progression Note    Patient Details  Name: Tracy Garcia MRN: [id number] Date of Birth: 1985-10-17  Transition of [facility]) CM/SW Contact  Beckie Busing, RN Phone Number:[phone]  08/07/2021, 3:22 PM  Clinical Narrative:    TOC continues to follow currently no recommendations.         Expected Discharge Plan and Services                                                 Social Determinants of Health (SDOH) Interventions    Readmission Risk Interventions     No data to display

## 2021-08-07 NOTE — Progress Notes (Signed)
Occupational Therapy Treatment Patient Details Name: Tracy Garcia MRN: [id number] DOB: 09/12/1985 Today's Date: 08/07/2021   History of present illness Pt is a 36 y.o. female with recent dx RLE DVT (07/29/21), now admitted 07/30/21 with chest pain, SOB; workup for acute PE, L empyema/hemothorax. S/p L VATS, mini L thoracotomy 6/19. Other PMH includes anxiety, depression, bilateral hand tremors.   OT comments  Patient with increased incisional pain this am, stating she did not sleep well.  Patient limited to attempted bathroom usage, and deferring mobility in the hall until this afternoon.  Patient needing a little extra time and supervision, but should progress quickly as pain subsides.  OT will continue efforts, but no post acute OT is anticipated.      Recommendations for follow up therapy are one component of a multi-disciplinary discharge planning process, led by the attending physician.  Recommendations may be updated based on patient status, additional functional criteria and insurance authorization.    Follow Up Recommendations  No OT follow up    Assistance Recommended at Discharge PRN  Patient can return home with the following  Assistance with cooking/housework;Assist for transportation   Equipment Recommendations  None recommended by OT    Recommendations for Other Services      Precautions / Restrictions Precautions Precautions: Fall Precaution Comments: chest tube; watch HR, SpO2 Restrictions Weight Bearing Restrictions: No       Mobility Bed Mobility Overal bed mobility: Needs Assistance Bed Mobility: Supine to Sit     Supine to sit: Min guard, HOB elevated Sit to supine: Min assist, HOB elevated   General bed mobility comments: Increased time to sit given discomfort, and min A to bring legs onto the bed. Patient Response: Anxious  Transfers Overall transfer level: Needs assistance Equipment used: 1 person hand held assist Transfers: Sit to/from Stand Sit  to Stand: Min guard, From elevated surface                 Balance Overall balance assessment: Needs assistance Sitting-balance support: No upper extremity supported, Feet supported Sitting balance-Leahy Scale: Fair     Standing balance support: Single extremity supported Standing balance-Leahy Scale: Fair                             ADL either performed or assessed with clinical judgement   ADL                                              Extremity/Trunk Assessment Upper Extremity Assessment Upper Extremity Assessment: Overall WFL for tasks assessed       Cervical / Trunk Assessment Cervical / Trunk Assessment: Normal                      Cognition Arousal/Alertness: Awake/alert Behavior During Therapy: WFL for tasks assessed/performed Overall Cognitive Status: Within Functional Limits for tasks assessed                                                       General Comments  Elevated HR with mobility.      Pertinent Vitals/ Pain       Pain Assessment Faces  Pain Scale: Hurts whole lot Pain Location: L-side abdomen (thoracotomy incision) Pain Descriptors / Indicators: Discomfort, Grimacing, Guarding Pain Intervention(s): Monitored during session                                                          Frequency  Min 2X/week        Progress Toward Goals  OT Goals(current goals can now be found in the care plan section)  Progress towards OT goals: Progressing toward goals  Acute Rehab OT Goals OT Goal Formulation: With patient Time For Goal Achievement: 08/20/21 Potential to Achieve Goals: Good  Plan Discharge plan remains appropriate    Co-evaluation                 AM-PAC OT "6 Clicks" Daily Activity     Outcome Measure   Help from another person eating meals?: None Help from another person taking care of personal grooming?: A Little Help from  another person toileting, which includes using toliet, bedpan, or urinal?: A Little Help from another person bathing (including washing, rinsing, drying)?: A Little Help from another person to put on and taking off regular upper body clothing?: A Little Help from another person to put on and taking off regular lower body clothing?: A Little 6 Click Score: 19    End of Session    OT Visit Diagnosis: Unsteadiness on feet (R26.81);Pain   Activity Tolerance Patient limited by pain   Patient Left in bed;with call bell/phone within reach;with family/visitor present   Nurse Communication Mobility status        Time: 1610-9604 OT Time Calculation (min): 13 min  Charges: OT General Charges $OT Visit: 1 Visit OT Treatments $Therapeutic Activity: 8-22 mins  08/07/2021  RP, OTR/L  Acute Rehabilitation Services  Office:  [phone]   Suzanna Obey 08/07/2021, 10:17 AM

## 2021-08-08 ENCOUNTER — Inpatient Hospital Stay (HOSPITAL_COMMUNITY): Payer: 59

## 2021-08-08 DIAGNOSIS — F419 Anxiety disorder, unspecified: Secondary | ICD-10-CM | POA: Diagnosis not present

## 2021-08-08 DIAGNOSIS — R071 Chest pain on breathing: Secondary | ICD-10-CM | POA: Diagnosis not present

## 2021-08-08 DIAGNOSIS — I2699 Other pulmonary embolism without acute cor pulmonale: Secondary | ICD-10-CM | POA: Diagnosis not present

## 2021-08-08 DIAGNOSIS — J9601 Acute respiratory failure with hypoxia: Secondary | ICD-10-CM | POA: Diagnosis not present

## 2021-08-08 LAB — AEROBIC/ANAEROBIC CULTURE W GRAM STAIN (SURGICAL/DEEP WOUND)
Culture: NO GROWTH
Culture: NO GROWTH
Gram Stain: NONE SEEN
Gram Stain: NONE SEEN

## 2021-08-08 LAB — BASIC METABOLIC PANEL
Anion gap: 8 (ref 5–15)
BUN: 8 mg/dL (ref 6–20)
CO2: 21 mmol/L — ABNORMAL LOW (ref 22–32)
Calcium: 7.8 mg/dL — ABNORMAL LOW (ref 8.9–10.3)
Chloride: 110 mmol/L (ref 98–111)
Creatinine, Ser: 0.65 mg/dL (ref 0.44–1.00)
GFR, Estimated: >60 mL/min (ref >60)
Glucose, Bld: 118 mg/dL — ABNORMAL HIGH (ref 70–99)
Potassium: 3.6 mmol/L (ref 3.5–5.1)
Sodium: 139 mmol/L (ref 135–145)

## 2021-08-08 LAB — CBC
HCT: 33.6 % — ABNORMAL LOW (ref 36.0–46.0)
Hemoglobin: 11.8 g/dL — ABNORMAL LOW (ref 12.0–15.0)
MCH: 30.9 pg (ref 26.0–34.0)
MCHC: 35.1 g/dL (ref 30.0–36.0)
MCV: 88 fL (ref 80.0–100.0)
Platelets: 322 10*3/uL (ref 150–400)
RBC: 3.82 MIL/uL — ABNORMAL LOW (ref 3.87–5.11)
RDW: 13.4 % (ref 11.5–15.5)
WBC: 14.3 10*3/uL — ABNORMAL HIGH (ref 4.0–10.5)
nRBC: 0 % (ref 0.0–0.2)

## 2021-08-08 LAB — HEPARIN LEVEL (UNFRACTIONATED)
Heparin Unfractionated: 0.36 IU/mL (ref 0.30–0.70)
Heparin Unfractionated: 0.4 IU/mL (ref 0.30–0.70)

## 2021-08-08 MED ORDER — LEVALBUTEROL HCL 1.25 MG/0.5ML IN NEBU: 1.2500 mg | INHALATION_SOLUTION | Freq: Four times a day (QID) | RESPIRATORY_TRACT | Status: DC | PRN

## 2021-08-08 MED ORDER — ACETAMINOPHEN 325 MG PO TABS: 650.0000 mg | ORAL_TABLET | Freq: Four times a day (QID) | ORAL | Status: DC | PRN

## 2021-08-08 NOTE — Progress Notes (Signed)
ANTICOAGULATION CONSULT NOTE  Pharmacy Consult for Heparin  Indication: pulmonary embolus and RLE DVT  Allergies  Allergen Reactions   Shellfish Allergy Hives    Itching     Patient Measurements: Height: 6' (182.9 cm) Weight: 122 kg (268 lb 15.4 oz) IBW/kg (Calculated) : 73.1 Heparin Dosing Weight: 100 kg  Vital Signs: Temp: 98.5 F (36.9 C) (06/23 1959) Temp Source: Oral (06/23 1959) BP: 137/71 (06/23 1959) Pulse Rate: 80 (06/23 1959)  Labs: Recent Labs    08/06/21 0622 08/06/21 0907 08/06/21 1549 08/07/21 0545 08/07/21 1539 08/08/21 0037  HGB 7.1*  --  12.1 11.9*  --  11.8*  HCT 20.5*  --  35.6* 34.0*  --  33.6*  PLT 348  --  331 301  --  322  HEPARINUNFRC 0.21*   < >  --  0.21* 0.29* 0.36  CREATININE 0.70  --   --  0.61  --  0.65   < > = values in this interval not displayed.     Estimated Creatinine Clearance: 145 mL/min (by C-G formula based on SCr of 0.65 mg/dL).   Assessment: 36 y.o. female with PE/DVT s/p left VATS and evacuation of hematoma 6/19, for heparin. Heparin restarted post-op with no bolus and conservative dosing (she was on 2350 units/hr pre-op).    Heparin level therapeutic: 0.36 on 2200 units/hr; no infusion related issue or overt bleeding reported  Goal of Therapy:  Heparin level 0.3-0.7 units/ml (aiming for 0.3-0.5 per Donata Clay request) Monitor platelets by anticoagulation protocol: Yes   Plan:  Continue heparin gtt at 2200 units/hr F/u 6 hour heparin level then daily heparin level and CBC  Ruben Im, PharmD Clinical Pharmacist 08/08/2021 1:57 AM Please check AMION for all [facility] Pharmacy numbers

## 2021-08-09 ENCOUNTER — Inpatient Hospital Stay (HOSPITAL_COMMUNITY): Payer: 59

## 2021-08-09 DIAGNOSIS — R071 Chest pain on breathing: Secondary | ICD-10-CM | POA: Diagnosis not present

## 2021-08-09 DIAGNOSIS — J9601 Acute respiratory failure with hypoxia: Secondary | ICD-10-CM | POA: Diagnosis not present

## 2021-08-09 DIAGNOSIS — F419 Anxiety disorder, unspecified: Secondary | ICD-10-CM | POA: Diagnosis not present

## 2021-08-09 DIAGNOSIS — I2699 Other pulmonary embolism without acute cor pulmonale: Secondary | ICD-10-CM | POA: Diagnosis not present

## 2021-08-09 LAB — CBC
HCT: 35.5 % — ABNORMAL LOW (ref 36.0–46.0)
Hemoglobin: 12.5 g/dL (ref 12.0–15.0)
MCH: 32 pg (ref 26.0–34.0)
MCHC: 35.2 g/dL (ref 30.0–36.0)
MCV: 90.8 fL (ref 80.0–100.0)
Platelets: 357 10*3/uL (ref 150–400)
RBC: 3.91 MIL/uL (ref 3.87–5.11)
RDW: 13.5 % (ref 11.5–15.5)
WBC: 14.3 10*3/uL — ABNORMAL HIGH (ref 4.0–10.5)
nRBC: 0 % (ref 0.0–0.2)

## 2021-08-09 LAB — BASIC METABOLIC PANEL
Anion gap: 8 (ref 5–15)
BUN: 10 mg/dL (ref 6–20)
CO2: 18 mmol/L — ABNORMAL LOW (ref 22–32)
Calcium: 8.1 mg/dL — ABNORMAL LOW (ref 8.9–10.3)
Chloride: 112 mmol/L — ABNORMAL HIGH (ref 98–111)
Creatinine, Ser: 0.63 mg/dL (ref 0.44–1.00)
GFR, Estimated: >60 mL/min (ref >60)
Glucose, Bld: 130 mg/dL — ABNORMAL HIGH (ref 70–99)
Potassium: 4 mmol/L (ref 3.5–5.1)
Sodium: 138 mmol/L (ref 135–145)

## 2021-08-09 LAB — HEPARIN LEVEL (UNFRACTIONATED): Heparin Unfractionated: 0.52 IU/mL (ref 0.30–0.70)

## 2021-08-09 LAB — VANCOMYCIN, PEAK: Vancomycin Pk: 30 ug/mL (ref 30–40)

## 2021-08-09 LAB — VANCOMYCIN, TROUGH: Vancomycin Tr: 15 ug/mL (ref 15–20)

## 2021-08-09 MED ORDER — RIVAROXABAN 15 MG PO TABS
15.0000 mg | ORAL_TABLET | Freq: Two times a day (BID) | ORAL | Status: DC
  Administered 2021-08-09 – 2021-08-10 (×2): 15 mg via ORAL
  Filled 2021-08-09 (×2): qty 1

## 2021-08-09 MED ORDER — RISAQUAD PO CAPS
2.0000 | ORAL_CAPSULE | Freq: Three times a day (TID) | ORAL | Status: DC
  Administered 2021-08-09 (×2): 2 via ORAL
  Filled 2021-08-09 (×4): qty 2

## 2021-08-09 MED ORDER — RISAQUAD PO CAPS: 1.0000 | ORAL_CAPSULE | Freq: Every day | ORAL | Status: DC

## 2021-08-09 MED ORDER — VANCOMYCIN HCL IN DEXTROSE 1-5 GM/200ML-% IV SOLN
1000.0000 mg | Freq: Two times a day (BID) | INTRAVENOUS | Status: DC
  Administered 2021-08-09 – 2021-08-10 (×2): 1000 mg via INTRAVENOUS
  Filled 2021-08-09 (×3): qty 200

## 2021-08-09 MED ORDER — BACID PO TABS: 2.0000 | ORAL_TABLET | Freq: Three times a day (TID) | ORAL | Status: DC

## 2021-08-09 MED ORDER — RIVAROXABAN 20 MG PO TABS: 20.0000 mg | ORAL_TABLET | Freq: Every day | ORAL | Status: DC

## 2021-08-10 ENCOUNTER — Inpatient Hospital Stay (HOSPITAL_COMMUNITY): Payer: 59

## 2021-08-10 DIAGNOSIS — J9601 Acute respiratory failure with hypoxia: Secondary | ICD-10-CM | POA: Diagnosis not present

## 2021-08-10 DIAGNOSIS — R071 Chest pain on breathing: Secondary | ICD-10-CM | POA: Diagnosis not present

## 2021-08-10 DIAGNOSIS — I824Y1 Acute embolism and thrombosis of unspecified deep veins of right proximal lower extremity: Secondary | ICD-10-CM | POA: Diagnosis not present

## 2021-08-10 DIAGNOSIS — I2699 Other pulmonary embolism without acute cor pulmonale: Secondary | ICD-10-CM | POA: Diagnosis not present

## 2021-08-10 LAB — CBC
HCT: 36 % (ref 36.0–46.0)
Hemoglobin: 12.5 g/dL (ref 12.0–15.0)
MCH: 31.6 pg (ref 26.0–34.0)
MCHC: 34.7 g/dL (ref 30.0–36.0)
MCV: 90.9 fL (ref 80.0–100.0)
Platelets: 384 10*3/uL (ref 150–400)
RBC: 3.96 MIL/uL (ref 3.87–5.11)
RDW: 13.2 % (ref 11.5–15.5)
WBC: 12.6 10*3/uL — ABNORMAL HIGH (ref 4.0–10.5)
nRBC: 0 % (ref 0.0–0.2)

## 2021-08-10 LAB — BASIC METABOLIC PANEL WITH GFR
Anion gap: 6 (ref 5–15)
BUN: 11 mg/dL (ref 6–20)
CO2: 23 mmol/L (ref 22–32)
Calcium: 8.3 mg/dL — ABNORMAL LOW (ref 8.9–10.3)
Chloride: 109 mmol/L (ref 98–111)
Creatinine, Ser: 0.68 mg/dL (ref 0.44–1.00)
GFR, Estimated: >60 mL/min
Glucose, Bld: 98 mg/dL (ref 70–99)
Potassium: 3.9 mmol/L (ref 3.5–5.1)
Sodium: 138 mmol/L (ref 135–145)

## 2021-08-10 LAB — MAGNESIUM: Magnesium: 1.9 mg/dL (ref 1.7–2.4)

## 2021-08-10 LAB — PHOSPHORUS: Phosphorus: 4.1 mg/dL (ref 2.5–4.6)

## 2021-08-10 MED ORDER — SENNOSIDES-DOCUSATE SODIUM 8.6-50 MG PO TABS: 1.0000 | ORAL_TABLET | Freq: Every evening | ORAL | Status: DC | PRN

## 2021-08-10 MED ORDER — POLYETHYLENE GLYCOL 3350 17 G PO PACK: 17.0000 g | PACK | Freq: Every day | ORAL | 0 refills | Status: AC | PRN

## 2021-08-10 MED ORDER — OXYCODONE HCL 5 MG PO TABS
5.0000 mg | ORAL_TABLET | Freq: Four times a day (QID) | ORAL | 0 refills | Status: DC | PRN
Start: 2021-08-10 — End: 2022-04-08

## 2021-08-10 MED ORDER — TRAZODONE HCL 50 MG PO TABS: 50.0000 mg | ORAL_TABLET | Freq: Every evening | ORAL | Status: DC | PRN

## 2021-08-10 MED ORDER — RIVAROXABAN 20 MG PO TABS: 20.0000 mg | ORAL_TABLET | Freq: Every day | ORAL | Status: DC

## 2021-08-10 MED ORDER — PANTOPRAZOLE SODIUM 40 MG PO TBEC
40.0000 mg | DELAYED_RELEASE_TABLET | Freq: Every day | ORAL | 0 refills | Status: AC
Start: 2021-08-10 — End: ?

## 2021-08-10 MED ORDER — METOPROLOL TARTRATE 5 MG/5ML IV SOLN: 5.0000 mg | INTRAVENOUS | Status: DC | PRN

## 2021-08-10 MED ORDER — AMOXICILLIN-POT CLAVULANATE 875-125 MG PO TABS: 1.0000 | ORAL_TABLET | Freq: Two times a day (BID) | ORAL | 0 refills | Status: AC

## 2021-08-10 MED ORDER — ALBUTEROL SULFATE HFA 108 (90 BASE) MCG/ACT IN AERS: 2.0000 | INHALATION_SPRAY | Freq: Four times a day (QID) | RESPIRATORY_TRACT | 2 refills | Status: AC | PRN

## 2021-08-10 MED ORDER — GUAIFENESIN 100 MG/5ML PO LIQD: 5.0000 mL | ORAL | Status: DC | PRN

## 2021-08-10 MED ORDER — HYDRALAZINE HCL 20 MG/ML IJ SOLN: 10.0000 mg | INTRAMUSCULAR | Status: DC | PRN

## 2021-08-10 NOTE — Discharge Summary (Signed)
Physician Discharge Summary  Tracy Garcia LKG:[id number] DOB: [birth year] DOA: 07/30/2021  PCP: Fayrene Helper, NP  Admit date: 07/30/2021 Discharge date: 08/10/2021  Admitted From: Home Disposition: Home  Recommendations for Outpatient Follow-up:  Follow up with PCP in 1-2 weeks Please obtain BMP/CBC in one week your next doctors visit.  Augmentin prescribed Continue home Xarelto 20 mg daily Follow-up outpatient PCP in about 1-2 weeks Bronchodilator inhaler prescribed   Discharge Condition: Stable CODE STATUS: Full code Diet recommendation: Regular  Brief/Interim Summary: 35 year old with history of obesity, HLD, tobacco use comes to the hospital with sudden onset of shortness of breath and left-sided chest pain after recently being diagnosed with right lower extremity DVT by her PCP.  In ED on 6/15 patient was diagnosed with PE and evidence of left-sided pulmonary infarct.  Echocardiogram showed EF of 55%, started on IV heparin drip.  On 6/18 patient had worsening of her symptoms, CT showed decrease in clot burden but concerns of large right-sided pleural effusion and she was moved to the ICU.  She was started on IV vancomycin and cefepime.  CT surgery took her to the OR on 6/19 for VATS.  Slowly her chest tube was removed on 6/25 without any issues. I spoke with Dr. Cliffton Asters on the day of discharge, patient was cleared. I also discussed case with patient's fianc at bedside.     Assessment & Plan:  Principal Problem:   Acute pulmonary embolism (HCC) Active Problems:   Pulmonary infarction [facility])   DVT, lower extremity (HCC)   Tachycardia   Chest pain   Pleural effusion on right   Sepsis (HCC)   Anxiety   High cholesterol   Acute respiratory failure with hypoxia (HCC)   Tobacco abuse    Acute pulmonary embolism without right heart strain  Acute hypoxic respiratory failure Pleural effusion-concern for left empyema/hemothorax Pulmonary infarct: -Patient was initially on  heparin drip course was complicated by VATS on 6/19 with chest tube placement which was eventually removed on 08/09/2021.  Cultures remain negative.  CT surgery recommends 2 weeks of oral Augmentin upon discharge.  Chest tube removed successfully on 6/25.  I spoke with Dr. Cliffton Asters on the day of discharge   Acute RLE DVT - On Xarelto 20 mg daily.  Follow-up outpatient PCP   Left pneumothorax -Chest x-ray stable this morning   Anemia, normocytic  -Likely from acute blood loss.  Status post 2 unit PRBC 6/22.  Hemoglobin now stable   Sepsis due to pneumonia concern for left empyema following pulmonary infarct.   -Ongoing treatment.  Sepsis physiology improved   Diarrhea: -Supportive care   Anxiety disorder:prn Xanax   Hyperlipidemia: Crestor   Tobacco abuse: Recommend cessation   Class II Obesity:Patient's Body mass index is 36.48 kg/m. : Will benefit with PCP follow-up, weight loss  healthy lifestyle and outpatient sleep evaluation.             Assessment and Plan: No notes have been filed under this hospital service. Service: Hospitalist      Body mass index is 36.48 kg/m.       Discharge Diagnoses:  Principal Problem:   Acute pulmonary embolism (HCC) Active Problems:   Pulmonary infarction (HCC)   DVT, lower extremity (HCC)   Tachycardia   Chest pain   Pleural effusion on right   Sepsis (HCC)   Anxiety   High cholesterol   Acute respiratory failure with hypoxia (HCC)   Tobacco abuse      Consultations: CT surgery  Subjective: Feels well no complaints, wishes to go home. Fianc at bedside  Discharge Exam: Vitals:   08/10/21 0430 08/10/21 0759  BP: (!) 146/80 137/74  Pulse:  97  Resp: 19 19  Temp: 98.2 F (36.8 C) 98.4 F (36.9 C)  SpO2: 94% 95%   Vitals:   08/09/21 2030 08/09/21 2342 08/10/21 0430 08/10/21 0759  BP: 130/83 128/68 (!) 146/80 137/74  Pulse: 84   97  Resp: (!) 28 (!) 21 19 19   Temp: 98.2 F (36.8 C) 98.1 F (36.7  C) 98.2 F (36.8 C) 98.4 F (36.9 C)  TempSrc: Oral Oral Oral Oral  SpO2: 96% 96% 94% 95%  Weight:      Height:        General: Pt is alert, awake, not in acute distress Cardiovascular: RRR, S1/S2 +, no rubs, no gallops Respiratory: CTA bilaterally, no wheezing, no rhonchi Abdominal: Soft, NT, ND, bowel sounds + Extremities: no edema, no cyanosis  Discharge Instructions   Allergies as of 08/10/2021       Reactions   Shellfish Allergy Hives   Itching         Medication List     STOP taking these medications    XARELTO STARTER PACK PO Replaced by: rivaroxaban 20 MG Tabs tablet       TAKE these medications    albuterol 108 (90 Base) MCG/ACT inhaler Commonly known as: VENTOLIN HFA Inhale 2 puffs into the lungs every 6 (six) hours as needed for wheezing or shortness of breath.   amoxicillin-clavulanate 875-125 MG tablet Commonly known as: AUGMENTIN Take 1 tablet by mouth 2 (two) times daily for 14 days.   Nurtec 75 MG Tbdp Generic drug: Rimegepant Sulfate Take 1 tablet by mouth daily as needed (pain).   NuvaRing 0.12-0.015 MG/24HR vaginal ring Generic drug: etonogestrel-ethinyl estradiol Place 1 each vaginally every 28 (twenty-eight) days.   oxyCODONE 5 MG immediate release tablet Commonly known as: Oxy IR/ROXICODONE Take 1 tablet (5 mg total) by mouth every 6 (six) hours as needed for breakthrough pain.   pantoprazole 40 MG tablet Commonly known as: PROTONIX Take 1 tablet (40 mg total) by mouth daily.   polyethylene glycol 17 g packet Commonly known as: MIRALAX / GLYCOLAX Take 17 g by mouth daily as needed for mild constipation or moderate constipation.   rivaroxaban 20 MG Tabs tablet Commonly known as: XARELTO Take 1 tablet (20 mg total) by mouth daily with supper. Start taking on: August 21, 2021 Replaces: Carlis Stable PACK PO   rosuvastatin 20 MG tablet Commonly known as: CRESTOR Take 20 mg by mouth at bedtime.        Follow-up  Information     [facility] [place] Go on 08/24/2021.   Specialty: Cardiothoracic Surgery Why: PA/LAT CXR to be taken (at [facility] which is in the same building as Dr. Zenaida Niece Trigt's office) on 07/10 at 1:30 pm'Appointment time is at 2:00 pm Contact information: [address] [place], [address] [place] [address] [phone]               Allergies  Allergen Reactions   Shellfish Allergy Hives    Itching     You were cared for by a hospitalist during your hospital stay. If you have any questions about your discharge medications or the care you received while you were in the hospital after you are discharged, you can call the unit and asked to speak with the hospitalist on call if  the hospitalist that took care of you is not available. Once you are discharged, your primary care physician will handle any further medical issues. Please note that no refills for any discharge medications will be authorized once you are discharged, as it is imperative that you return to your primary care physician (or establish a relationship with a primary care physician if you do not have one) for your aftercare needs so that they can reassess your need for medications and monitor your lab values.   Procedures/Studies: DG Chest 2 View  Result Date: 08/10/2021 CLINICAL DATA:  Empyema EXAM: CHEST - 2 VIEW COMPARISON:  08/09/2021 FINDINGS: Cardiomediastinal silhouette and pulmonary vasculature are within normal limits. Unchanged elevation of left hemidiaphragm with adjacent airspace opacity and likely small effusion. Right lung remains well aerated. IMPRESSION: Unchanged elevation of left hemidiaphragm with adjacent airspace opacity and likely small pleural effusion. Electronically Signed   By: Acquanetta Belling M.D.   On: 08/10/2021 08:10   DG CHEST PORT 1 VIEW  Result Date: 08/09/2021 CLINICAL DATA:  Empyema.  Chest pain and shortness of breath. EXAM:  PORTABLE CHEST 1 VIEW COMPARISON:  08/09/2021 FINDINGS: There has been interval removal of the left-sided chest tubes. No pneumothorax identified. Left pleural effusion with overlying left lower lung opacity is again noted, stable to improved in the interval. Right lung appears clear. Visualized osseous structures are unremarkable. IMPRESSION: 1. Stable to improved left pleural effusion with overlying left lower lung opacity. 2. Interval removal of left chest tubes. No pneumothorax identified. Electronically Signed   By: Signa Kell M.D.   On: 08/09/2021 17:34   DG CHEST PORT 1 VIEW  Result Date: 08/09/2021 CLINICAL DATA:  Empyema EXAM: PORTABLE CHEST 1 VIEW COMPARISON:  August 08, 2021 FINDINGS: The right IJ has been removed. 2 left chest tubes remain. No pneumothorax. The cardiomediastinal silhouette and right lung are unchanged. Opacity and effusion in the left base is similar. No other changes. IMPRESSION: 1. Opacity and underlying effusion on the left is stable. 2. Stable left chest tubes.  No pneumothorax. 3. No other changes. Electronically Signed   By: Gerome Sam III M.D.   On: 08/09/2021 08:19   DG CHEST PORT 1 VIEW  Result Date: 08/08/2021 CLINICAL DATA:  Chest pain. Evaluate internal jugular line and left-sided chest tubes. Pleural effusion. Opacity. Evaluate for pneumothorax. EXAM: PORTABLE CHEST 1 VIEW COMPARISON:  AP chest 08/07/2021 and 08/06/2021; CT chest 08/02/2021 FINDINGS: Right internal jugular central venous catheter tip again overlies the mid to central superior vena cava. Two left-sided chest tubes with tips overlying the superior left hemithorax are unchanged on frontal view. Mild-to-moderate left pleural effusion with left basilar heterogeneous airspace opacity, similar to prior. Minimal right basilar linear subsegmental atelectasis, unchanged. No pneumothorax is identified. IMPRESSION: 1. No significant change from prior. 2. Right internal jugular catheter tip and 2  left-sided chest tubes, unchanged in position. 3. No significant change in mild-to-moderate left pleural effusion with associated left basilar heterogeneous airspace opacity. Electronically Signed   By: Neita Garnet M.D.   On: 08/08/2021 07:18   DG CHEST PORT 1 VIEW  Result Date: 08/07/2021 CLINICAL DATA:  Left pleural effusion EXAM: PORTABLE CHEST 1 VIEW COMPARISON:  Chest x-ray dated August 06, 2021 FINDINGS: Unchanged position of right IJ line and left-sided chest tubes. Stable size of small left pleural effusion. No new parenchymal opacity. No evidence of pneumothorax. IMPRESSION: Stable small left pleural effusion with chest tubes in place. Electronically Signed   By: Tacey Ruiz  Strickland M.D.   On: 08/07/2021 08:05   DG CHEST PORT 1 VIEW  Result Date: 08/06/2021 CLINICAL DATA:  Status post VATS EXAM: PORTABLE CHEST 1 VIEW COMPARISON:  Chest radiograph 1 day prior FINDINGS: The right IJ vascular catheter tip terminates in the lower SVC. Two left-sided chest tubes remain in place. The cardiomediastinal silhouette is stable. A small left pleural effusion and adjacent patchy opacities are not significantly changed. The previously seen left pneumothorax is no longer appreciated. The right lung remains clear. There is no significant right effusion. There is no right pneumothorax. The bones are stable. IMPRESSION: 1. The previously seen small left pneumothorax is no longer identified. 2. Unchanged small left pleural effusion and adjacent left basilar opacities. Electronically Signed   By: Lesia Hausen M.D.   On: 08/06/2021 08:21   DG Chest Port 1 View  Addendum Date: 08/05/2021   ADDENDUM REPORT: 08/05/2021 10:03 ADDENDUM: Impression #2 called by telephone at the time of interpretation on 08/05/2021 at 8:42 am to provider Dr. Merrily Pew, who verbally acknowledged these results. Electronically Signed   By: Jackey Loge D.O.   On: 08/05/2021 10:03   Result Date: 08/05/2021 CLINICAL DATA:  Provided history: Post  VATS, acute PE. EXAM: PORTABLE CHEST 1 VIEW COMPARISON:  Prior chest radiograph 08/04/2021 and earlier. CT angiogram chest 08/02/2021. FINDINGS: Interval extubation. Interval removal of a previously demonstrated enteric tube. Unchanged position of a right IJ approach central venous catheter with tip projecting at the at the level of the lower SVC. Unchanged position of 2 left-sided chest tubes. Stable cardiomediastinal silhouette. Irregular opacities within the left mid to lower lung field, which may reflect atelectasis and/or consolidation, slightly increased. Small left pleural effusion. Trace left pneumothorax, new from the prior exam. No appreciable airspace consolidation within the right lung. IMPRESSION: Interval extubation and enteric tube removal. Small left apical pneumothorax, new from prior exams (in the presence 2 left-sided chest tubes). Irregular opacities within the left mid-to-lower lung field have increased, and may reflect atelectasis and/or consolidation. Persistent small left pleural effusion. Electronically Signed: By: Jackey Loge D.O. On: 08/05/2021 08:28   DG Chest Port 1 View  Result Date: 08/04/2021 CLINICAL DATA:  Post VATS, PE EXAM: PORTABLE CHEST 1 VIEW COMPARISON:  08/03/2021 FINDINGS: No significant change in AP portable chest radiograph. Heterogeneous airspace opacity of the left lung and small left pleural effusion. Left-sided chest tubes are in position with no significant residual pneumothorax. Support apparatus including endotracheal tube, esophagogastric tube, and right neck vascular catheter remain in unchanged, appropriate position. IMPRESSION: 1. No significant change in AP portable chest radiograph. Heterogeneous airspace opacity of the left lung and small left pleural effusion. 2. Left-sided chest tubes are in position with no significant residual pneumothorax. 3. Unchanged support apparatus. Electronically Signed   By: Jearld Lesch M.D.   On: 08/04/2021 08:24   DG  CHEST PORT 1 VIEW  Result Date: 08/03/2021 CLINICAL DATA:  Left pleural effusion EXAM: PORTABLE CHEST 1 VIEW COMPARISON:  Previous studies including the examination done earlier today FINDINGS: Transverse diameter of heart is within normal limits. There is interval decrease in left pleural effusion. There is interval placement of left chest tube. There is possible second left chest tube in the lateral aspect of the left hemithorax. There is no demonstrable pneumothorax. Subcutaneous emphysema is seen in the left chest wall. Tip of endotracheal tube is 3.9 cm above the carina. Tip enteric tube is seen in the stomach. Tip of right IJ central venous catheter is  seen in the superior vena cava. Central pulmonary vessels are prominent. Increased interstitial markings are seen in the parahilar regions and lower lung fields. IMPRESSION: There is significant interval decrease in left pleural effusion after placement of left chest tube. There is no pneumothorax. Central pulmonary vessels are prominent along with prominence of interstitial markings in the mid and lower lung fields suggesting possible CHF. Electronically Signed   By: Ernie Avena M.D.   On: 08/03/2021 13:20   DG Chest Port 1 View  Result Date: 08/03/2021 CLINICAL DATA:  Left hemothorax EXAM: PORTABLE CHEST 1 VIEW COMPARISON:  Chest CT from one day prior. 01/22/2011 chest radiograph. FINDINGS: Stable cardiomediastinal silhouette with normal heart size. No pneumothorax. Low lung volumes. Moderate left pleural effusion is stable. No right pleural effusion. Dense patchy consolidation throughout the mid to lower left lung is unchanged. Mild right basilar atelectasis is stable. IMPRESSION: 1. Stable moderate left pleural effusion. 2. Stable dense patchy consolidation throughout the mid to lower left lung. 3. Stable mild right basilar atelectasis. Electronically Signed   By: Delbert Phenix M.D.   On: 08/03/2021 08:06   ECHOCARDIOGRAM LIMITED  Result  Date: 08/02/2021    ECHOCARDIOGRAM REPORT   Patient Name:   Lovetta Marsolek Date of Exam: 08/02/2021 Medical Rec #:  [id number]  Height:       72.0 in Accession #:    [id number] Weight:       255.3 lb Date of Birth:  11-18-1985 BSA:          2.363 m Patient Age:    35 years   BP:           121/78 mmHg Patient Gender: F          HR:           140 bpm. Exam Location:  Inpatient Procedure: 2D Echo STAT ECHO Indications:    pulmonary embolus. dyspnea.  History:        Patient has prior history of Echocardiogram examinations, most                 recent 07/31/2021. Sepsis.; Risk Factors:Dyslipidemia.  Sonographer:    Delcie Roch RDCS Referring Phys: [id number] DAWOOD S ELGERGAWY  Sonographer Comments: Technically difficult study due to poor echo windows and Technically challenging study due to limited acoustic windows. Image acquisition challenging due to respiratory motion. IMPRESSIONS  1. Image quality is poor. Riht ventricle appears to be functioning well and is not dilated.  2. Left ventricular ejection fraction, by estimation, is 65 to 70%. The left ventricle has normal function. The left ventricle has no regional wall motion abnormalities. Left ventricular diastolic function could not be evaluated.  3. Right ventricular systolic function is normal. The right ventricular size is normal. There is normal pulmonary artery systolic pressure.  4. The mitral valve is normal in structure. No evidence of mitral valve regurgitation. No evidence of mitral stenosis.  5. The aortic valve was not well visualized. Aortic valve regurgitation is not visualized. No aortic stenosis is present.  6. The inferior vena cava is normal in size with greater than 50% respiratory variability, suggesting right atrial pressure of 3 mmHg. FINDINGS  Left Ventricle: Left ventricular ejection fraction, by estimation, is 65 to 70%. The left ventricle has normal function. The left ventricle has no regional wall motion abnormalities. The left ventricular  internal cavity size was normal in size. There is  no left ventricular hypertrophy. Left ventricular diastolic function could not be evaluated. Right Ventricle:  The right ventricular size is normal. No increase in right ventricular wall thickness. Right ventricular systolic function is normal. There is normal pulmonary artery systolic pressure. The tricuspid regurgitant velocity is 1.44 m/s, and  with an assumed right atrial pressure of 3 mmHg, the estimated right ventricular systolic pressure is 11.3 mmHg. Left Atrium: Left atrial size was normal in size. Right Atrium: Right atrial size was normal in size. Pericardium: There is no evidence of pericardial effusion. Mitral Valve: The mitral valve is normal in structure. No evidence of mitral valve regurgitation. No evidence of mitral valve stenosis. Tricuspid Valve: The tricuspid valve is normal in structure. Tricuspid valve regurgitation is not demonstrated. No evidence of tricuspid stenosis. Aortic Valve: The aortic valve was not well visualized. Aortic valve regurgitation is not visualized. No aortic stenosis is present. Pulmonic Valve: The pulmonic valve was normal in structure. Pulmonic valve regurgitation is not visualized. No evidence of pulmonic stenosis. Aorta: The aortic root is normal in size and structure. Venous: The inferior vena cava is normal in size with greater than 50% respiratory variability, suggesting right atrial pressure of 3 mmHg. IAS/Shunts: No atrial level shunt detected by color flow Doppler.  IVC IVC diam: 1.60 cm TRICUSPID VALVE TR Peak grad:   8.3 mmHg TR Vmax:        144.00 cm/s Chilton Si MD Electronically signed by Chilton Si MD Signature Date/Time: 08/02/2021/10:23:48 AM    Final    CT Angio Chest Pulmonary Embolism (PE) W or WO Contrast  Result Date: 08/02/2021 CLINICAL DATA:  Pulmonary embolus. EXAM: CT ANGIOGRAPHY CHEST WITH CONTRAST TECHNIQUE: Multidetector CT imaging of the chest was performed using the standard  protocol during bolus administration of intravenous contrast. Multiplanar CT image reconstructions and MIPs were obtained to evaluate the vascular anatomy. RADIATION DOSE REDUCTION: This exam was performed according to the departmental dose-optimization program which includes automated exposure control, adjustment of the mA and/or kV according to patient size and/or use of iterative reconstruction technique. CONTRAST:  OMNIPAQUE IOHEXOL 350 MG/ML SOLN COMPARISON:  CT chest dated March 01, 2021 FINDINGS: Cardiovascular: Decreased clot burden is seen in the left lower lobe pulmonary artery and its branches when compared with July 30, 2021 prior exam. Normal heart size. No pericardial effusion. No coronary artery calcifications. No atherosclerotic disease of the thoracic aorta. Mediastinum/Nodes: Patulous esophagus. Thyroid is unremarkable. No pathologically enlarged lymph nodes seen in the chest. Lungs/Pleura: Increased consolidation of the left lower lobe with central ground-glass. New large loculated left pleural effusion with associated atelectasis. New Peribronchovascular ground-glass opacities are seen in the right upper lobe. Right basilar atelectasis. No pneumothorax. Upper Abdomen: No acute abnormality. Musculoskeletal: No chest wall abnormality. No acute or significant osseous findings. Review of the MIP images confirms the above findings. IMPRESSION: 1. Decreased clot burden is seen in the left lower lobe pulmonary artery and its branches when compared with July 30, 2021 prior exam. 2. Consolidation of the left lower lobe with central ground-glass, increased when compared with prior exam and likely due to evolving pulmonary infarct. 3. New large loculated left pleural effusion with associated atelectasis. 4. New peribronchovascular ground-glass opacities of the right upper lobe, likely infectious or inflammatory. Electronically Signed   By: Allegra Lai M.D.   On: 08/02/2021 09:43    ECHOCARDIOGRAM COMPLETE  Result Date: 07/31/2021    ECHOCARDIOGRAM REPORT   Patient Name:   Tiahna Deguia Date of Exam: 07/31/2021 Medical Rec #:  [id number]  Height:       72.0  in Accession #:    [id number] Weight:       249.0 lb Date of Birth:  21-Sep-1985 BSA:          2.339 m Patient Age:    35 years   BP:           143/102 mmHg Patient Gender: F          HR:           106 bpm. Exam Location:  Inpatient Procedure: 2D Echo, Cardiac Doppler, Color Doppler and Intracardiac            Opacification Agent Indications:    I26.02 Pulmonary embolus  History:        Patient has no prior history of Echocardiogram examinations.                 Signs/Symptoms:Dyspnea and Shortness of Breath.  Sonographer:    Sheralyn Boatman RDCS Referring Phys: [id number] CAROLE N HALL  Sonographer Comments: Technically difficult study due to poor echo windows, suboptimal parasternal window, suboptimal apical window, suboptimal subcostal window and patient is morbidly obese. Image acquisition challenging due to patient body habitus. Very difficult images. Could not visualize RV. IMPRESSIONS  1. Left ventricular ejection fraction, by estimation, is 55%. The left ventricle has normal function. The left ventricle has no regional wall motion abnormalities. Left ventricular diastolic parameters were normal.  2. Right ventricular systolic function is normal. The right ventricular size is normal.  3. The pericardial effusion is posterior to the left ventricle.  4. The mitral valve is normal in structure. No evidence of mitral valve regurgitation. No evidence of mitral stenosis.  5. The aortic valve is tricuspid. Aortic valve regurgitation is not visualized. No aortic stenosis is present.  6. The inferior vena cava is normal in size with greater than 50% respiratory variability, suggesting right atrial pressure of 3 mmHg. FINDINGS  Left Ventricle: Left ventricular ejection fraction, by estimation, is 55%. The left ventricle has normal function. The left  ventricle has no regional wall motion abnormalities. Definity contrast agent was given IV to delineate the left ventricular endocardial borders. The left ventricular internal cavity size was normal in size. There is no left ventricular hypertrophy. Left ventricular diastolic parameters were normal. Right Ventricle: The right ventricular size is normal. No increase in right ventricular wall thickness. Right ventricular systolic function is normal. Left Atrium: Left atrial size was normal in size. Right Atrium: Right atrial size was normal in size. Pericardium: Trivial pericardial effusion is present. The pericardial effusion is posterior to the left ventricle. Mitral Valve: The mitral valve is normal in structure. No evidence of mitral valve regurgitation. No evidence of mitral valve stenosis. Tricuspid Valve: The tricuspid valve is normal in structure. Tricuspid valve regurgitation is trivial. No evidence of tricuspid stenosis. Aortic Valve: The aortic valve is tricuspid. Aortic valve regurgitation is not visualized. No aortic stenosis is present. Pulmonic Valve: The pulmonic valve was normal in structure. Pulmonic valve regurgitation is trivial. No evidence of pulmonic stenosis. Aorta: The aortic root is normal in size and structure. Venous: The inferior vena cava is normal in size with greater than 50% respiratory variability, suggesting right atrial pressure of 3 mmHg. IAS/Shunts: No atrial level shunt detected by color flow Doppler.  LEFT VENTRICLE PLAX 2D LVIDd:         5.00 cm     Diastology LVIDs:         4.20 cm     LV e' medial:  10.80 cm/s LV PW:         1.00 cm     LV E/e' medial:  6.7 LV IVS:        1.00 cm     LV e' lateral:   14.30 cm/s LVOT diam:     2.00 cm     LV E/e' lateral: 5.1 LV SV:         61 LV SV Index:   26 LVOT Area:     3.14 cm  LV Volumes (MOD) LV vol d, MOD A2C: 62.3 ml LV vol d, MOD A4C: 80.5 ml LV vol s, MOD A2C: 35.4 ml LV vol s, MOD A4C: 25.9 ml LV SV MOD A2C:     26.9 ml LV SV  MOD A4C:     80.5 ml LV SV MOD BP:      38.4 ml RIGHT VENTRICLE             IVC RV S prime:     14.20 cm/s  IVC diam: 1.50 cm TAPSE (M-mode): 1.5 cm LEFT ATRIUM             Index       RIGHT ATRIUM          Index LA diam:        3.40 cm 1.45 cm/m  RA Area:     9.35 cm LA Vol (A2C):   14.7 ml 6.29 ml/m  RA Volume:   18.40 ml 7.87 ml/m LA Vol (A4C):   14.6 ml 6.24 ml/m LA Biplane Vol: 15.8 ml 6.76 ml/m  AORTIC VALVE LVOT Vmax:   138.00 cm/s LVOT Vmean:  90.700 cm/s LVOT VTI:    0.194 m  AORTA Ao Root diam: 3.10 cm Ao Asc diam:  3.00 cm MITRAL VALVE MV Area (PHT): 5.88 cm    SHUNTS MV Decel Time: 129 msec    Systemic VTI:  0.19 m MV E velocity: 72.33 cm/s  Systemic Diam: 2.00 cm MV A velocity: 88.63 cm/s MV E/A ratio:  0.82 Charlton Haws MD Electronically signed by Charlton Haws MD Signature Date/Time: 07/31/2021/11:29:45 AM    Final    CT Angio Chest PE W and/or Wo Contrast  Result Date: 07/30/2021 CLINICAL DATA:  Chest pain and shortness of breath. EXAM: CT ANGIOGRAPHY CHEST WITH CONTRAST TECHNIQUE: Multidetector CT imaging of the chest was performed using the standard protocol during bolus administration of intravenous contrast. Multiplanar CT image reconstructions and MIPs were obtained to evaluate the vascular anatomy. RADIATION DOSE REDUCTION: This exam was performed according to the departmental dose-optimization program which includes automated exposure control, adjustment of the mA and/or kV according to patient size and/or use of iterative reconstruction technique. CONTRAST:  70mL OMNIPAQUE IOHEXOL 350 MG/ML SOLN COMPARISON:  Chest pain and shortness of breath. Patient has a known DVT. FINDINGS: Cardiovascular: The heart is normal in size. No pericardial effusion. No evidence of right heart strain. The RV LV ratio is normal. The aorta is normal in caliber. No dissection. The branch vessels are patent. The pulmonary arterial tree is well opacified. There is a large and fairly extensive pulmonary  embolism on the left side involving the left lower lobe pulmonary artery the proximal artery and its branches are completely occluded. I do not see any findings for pulmonary embolism on the right side. Mediastinum/Nodes: No mediastinal or hilar mass or lymphadenopathy. The esophagus is grossly normal. The thyroid gland is unremarkable. Lungs/Pleura: Pronounced left lower lobe atelectasis likely associated with the pulmonary embolism.  No pulmonary edema or pleural effusion. No pulmonary lesions. Upper Abdomen: No significant upper abdominal findings. Musculoskeletal: No breast masses, supraclavicular or axillary adenopathy. The bony thorax is intact. Review of the MIP images confirms the above findings. IMPRESSION: 1. Fairly extensive left-sided pulmonary embolism involving the left lower lobe pulmonary artery and its branches. No findings for right heart strain. No right-sided pulmonary emboli. 2. Pronounced left lower lobe atelectasis likely associated with the pulmonary embolism. 3. Normal thoracic aorta. Electronically Signed   By: Rudie Meyer M.D.   On: 07/30/2021 18:30   US Venous Img Lower Unilateral Right (DVT)  Result Date: 07/29/2021 CLINICAL DATA:  rt leg pain EXAM: RIGHT LOWER EXTREMITY VENOUS DOPPLER ULTRASOUND TECHNIQUE: Gray-scale sonography with graded compression, as well as color Doppler and duplex ultrasound were performed to evaluate the lower extremity deep venous systems from the level of the common femoral vein and including the common femoral, femoral, profunda femoral, popliteal and calf veins including the posterior tibial, peroneal and gastrocnemius veins when visible. The superficial great saphenous vein was also interrogated. Spectral Doppler was utilized to evaluate flow at rest and with distal augmentation maneuvers in the common femoral, femoral and popliteal veins. COMPARISON:  None Available. FINDINGS: Contralateral Common Femoral Vein: Respiratory phasicity is normal and  symmetric with the symptomatic side. No evidence of thrombus. Normal compressibility. Common Femoral Vein: Thrombus from the saphenofemoral junction extends 5 mm into the common femoral vein. Otherwise, patent. Saphenofemoral Junction: Thrombus extends into the saphofemoral junction by approximately 5 mm. Profunda Femoral Vein: No evidence of thrombus. Normal compressibility and flow on color Doppler imaging. Femoral Vein: No evidence of thrombus. Normal compressibility, respiratory phasicity and response to augmentation. Popliteal Vein: No evidence of thrombus. Normal compressibility, respiratory phasicity and response to augmentation. Calf Veins: No evidence of thrombus. Normal compressibility and flow on color Doppler imaging. Superficial Great Saphenous Vein: Positive for occlusive thrombus. Findings will be called to the provider by the performing technologist Abby (as documented in [facility] PACS). IMPRESSION: Positive for DVT involving the right saphofemoral junction extending into the common femoral vein with occlusive thrombus of the superficial great saphenous vein. Electronically Signed   By: Feliberto Harts M.D.   On: 07/29/2021 14:00     The results of significant diagnostics from this hospitalization (including imaging, microbiology, ancillary and laboratory) are listed below for reference.     Microbiology: Recent Results (from the past 240 hour(s))  MRSA Next Gen by PCR, Nasal     Status: None   Collection Time: 08/02/21 10:57 AM   Specimen: Nasal Mucosa; Nasal Swab  Result Value Ref Range Status   MRSA by PCR Next Gen NOT DETECTED NOT DETECTED Final    Comment: (NOTE) The GeneXpert MRSA Assay (FDA approved for NASAL specimens only), is one component of a comprehensive MRSA colonization surveillance program. It is not intended to diagnose MRSA infection nor to guide or monitor treatment for MRSA infections. Test performance is not FDA approved in patients less than 63  years old. Performed at [facility], [address] [place], [place] [address]   Surgical pcr screen     Status: None   Collection Time: 08/02/21 11:54 PM   Specimen: Nasal Mucosa; Nasal Swab  Result Value Ref Range Status   MRSA, PCR NEGATIVE NEGATIVE Final   Staphylococcus aureus NEGATIVE NEGATIVE Final    Comment: (NOTE) The Xpert SA Assay (FDA approved for NASAL specimens in patients 2 years of age and older), is one component  of a comprehensive surveillance program. It is not intended to diagnose infection nor to guide or monitor treatment. Performed at [facility], [address], [place], [place] [address]   Aerobic/Anaerobic Culture w Gram Stain (surgical/deep wound)     Status: None   Collection Time: 08/03/21 10:20 AM   Specimen: PATH Other; Body Fluid  Result Value Ref Range Status   Specimen Description PLEURAL LEFT  Final   Special Requests A  Final   Gram Stain NO WBC SEEN NO ORGANISMS SEEN   Final   Culture   Final    No growth aerobically or anaerobically. Performed at [facility], [address], [place], [place] [address]    Report Status 08/08/2021 FINAL  Final  Aerobic/Anaerobic Culture w Gram Stain (surgical/deep wound)     Status: None   Collection Time: 08/03/21 10:45 AM   Specimen: PATH Other; Tissue  Result Value Ref Range Status   Specimen Description PLEURAL LEFT PEEL  Final   Special Requests B  Final   Gram Stain NO WBC SEEN NO ORGANISMS SEEN   Final   Culture   Final    No growth aerobically or anaerobically. Performed at [facility], [address] [place], [place] [address]    Report Status 08/08/2021 FINAL  Final     Labs: BNP (last 3 results) Recent Labs    07/30/21 2145  BNP 24.9   Basic Metabolic Panel: Recent Labs  Lab 08/05/21 0437 08/06/21 0622 08/07/21 0545 08/08/21 0037 08/09/21 0749 08/10/21 0355  NA 139 139 138 139 138 138  K 3.0* 3.3* 3.9 3.6 4.0 3.9  CL 110 108 112* 110 112*  109  CO2 23 19* 20* 21* 18* 23  GLUCOSE 95 117* 106* 118* 130* 98  BUN 10 9 8 8 10 11   CREATININE 0.66 0.70 0.61 0.65 0.63 0.68  CALCIUM 7.5* 7.6* 7.6* 7.8* 8.1* 8.3*  MG 2.2  --   --   --   --  1.9  PHOS  --   --   --   --   --  4.1   Liver Function Tests: Recent Labs  Lab 08/05/21 0437  AST 21  ALT 16  ALKPHOS 67  BILITOT 0.6  PROT 5.4*  ALBUMIN 1.7*   No results for input(s): "LIPASE", "AMYLASE" in the last 168 hours. No results for input(s): "AMMONIA" in the last 168 hours. CBC: Recent Labs  Lab 08/06/21 1549 08/07/21 0545 08/08/21 0037 08/09/21 0749 08/10/21 0355  WBC 14.2* 13.5* 14.3* 14.3* 12.6*  HGB 12.1 11.9* 11.8* 12.5 12.5  HCT 35.6* 34.0* 33.6* 35.5* 36.0  MCV 88.8 88.3 88.0 90.8 90.9  PLT 331 301 322 357 384   Cardiac Enzymes: No results for input(s): "CKTOTAL", "CKMB", "CKMBINDEX", "TROPONINI" in the last 168 hours. BNP: Invalid input(s): "POCBNP" CBG: Recent Labs  Lab 08/04/21 2319 08/05/21 0436 08/05/21 0653 08/05/21 1116 08/05/21 1608  GLUCAP 145* 85 107* 98 96   D-Dimer No results for input(s): "DDIMER" in the last 72 hours. Hgb A1c No results for input(s): "HGBA1C" in the last 72 hours. Lipid Profile No results for input(s): "CHOL", "HDL", "LDLCALC", "TRIG", "CHOLHDL", "LDLDIRECT" in the last 72 hours. Thyroid function studies No results for input(s): "TSH", "T4TOTAL", "T3FREE", "THYROIDAB" in the last 72 hours.  Invalid input(s): "FREET3" Anemia work up No results for input(s): "VITAMINB12", "FOLATE", "FERRITIN", "TIBC", "IRON", "RETICCTPCT" in the last 72 hours. Urinalysis    Component Value Date/Time  COLORURINE YELLOW 08/11/2010 2352   APPEARANCEUR CLEAR 08/11/2010 2352   LABSPEC 1.012 08/11/2010 2352   PHURINE 7.0 08/11/2010 2352   GLUCOSEU NEGATIVE 08/11/2010 2352   HGBUR LARGE (A) 08/11/2010 2352   BILIRUBINUR NEGATIVE 08/11/2010 2352   KETONESUR NEGATIVE 08/11/2010 2352   PROTEINUR NEGATIVE 08/11/2010 2352    UROBILINOGEN 0.2 08/11/2010 2352   NITRITE NEGATIVE 08/11/2010 2352   LEUKOCYTESUR SMALL (A) 08/11/2010 2352   Sepsis Labs Recent Labs  Lab 08/07/21 0545 08/08/21 0037 08/09/21 0749 08/10/21 0355  WBC 13.5* 14.3* 14.3* 12.6*   Microbiology Recent Results (from the past 240 hour(s))  MRSA Next Gen by PCR, Nasal     Status: None   Collection Time: 08/02/21 10:57 AM   Specimen: Nasal Mucosa; Nasal Swab  Result Value Ref Range Status   MRSA by PCR Next Gen NOT DETECTED NOT DETECTED Final    Comment: (NOTE) The GeneXpert MRSA Assay (FDA approved for NASAL specimens only), is one component of a comprehensive MRSA colonization surveillance program. It is not intended to diagnose MRSA infection nor to guide or monitor treatment for MRSA infections. Test performance is not FDA approved in patients less than 75 years old. Performed at [facility], [address] [place], [place] [address]   Surgical pcr screen     Status: None   Collection Time: 08/02/21 11:54 PM   Specimen: Nasal Mucosa; Nasal Swab  Result Value Ref Range Status   MRSA, PCR NEGATIVE NEGATIVE Final   Staphylococcus aureus NEGATIVE NEGATIVE Final    Comment: (NOTE) The Xpert SA Assay (FDA approved for NASAL specimens in patients 60 years of age and older), is one component of a comprehensive surveillance program. It is not intended to diagnose infection nor to guide or monitor treatment. Performed at [facility], [address] [place], [place] [address]   Aerobic/Anaerobic Culture w Gram Stain (surgical/deep wound)     Status: None   Collection Time: 08/03/21 10:20 AM   Specimen: PATH Other; Body Fluid  Result Value Ref Range Status   Specimen Description PLEURAL LEFT  Final   Special Requests A  Final   Gram Stain NO WBC SEEN NO ORGANISMS SEEN   Final   Culture   Final    No growth aerobically or anaerobically. Performed at [facility], [address], [place], [place] [address]     Report Status 08/08/2021 FINAL  Final  Aerobic/Anaerobic Culture w Gram Stain (surgical/deep wound)     Status: None   Collection Time: 08/03/21 10:45 AM   Specimen: PATH Other; Tissue  Result Value Ref Range Status   Specimen Description PLEURAL LEFT PEEL  Final   Special Requests B  Final   Gram Stain NO WBC SEEN NO ORGANISMS SEEN   Final   Culture   Final    No growth aerobically or anaerobically. Performed at [facility], [address] [place], [place] [address]    Report Status 08/08/2021 FINAL  Final     Time coordinating discharge:  I have spent 35 minutes face to face with the patient and on the ward discussing the patients care, assessment, plan and disposition with other care givers. >50% of the time was devoted counseling the patient about the risks and benefits of treatment/Discharge disposition and coordinating care.   SIGNED:   Dimple Nanas, MD  [facility] 08/10/2021, 11:04 AM   If 7PM-7AM, please contact night-coverage

## 2021-08-10 NOTE — Progress Notes (Signed)
Occupational Therapy Treatment Patient Details Name: Tracy Garcia MRN: [id number] DOB: Mar 14, 1985 Today's Date: 08/10/2021   History of present illness 36 y.o. female with recent dx RLE DVT (07/29/21), now admitted 07/30/21 with chest pain, SOB; workup for acute PE, L empyema/hemothorax. S/p L VATS, mini L thoracotomy 6/19. PMH includes anxiety, depression, bilateral hand tremors.   OT comments  Pt progressing well towards established OT goals. Pt performing toileting, grooming, and functional mobility in hallway at Mod I level with increased time as needed. Providing pt with education on compensatory techniques for ADLs to decrease pain at L chest; including figure four with LB dressing, using cups for oral care, and log roll for bed mobility. Providing education and handout on energy conservation techniques for ADLs and IADLs; pt verbalized understanding. Continue to recommend dc to home and will continue to follow acutely as admitted.   Recommendations for follow up therapy are one component of a multi-disciplinary discharge planning process, led by the attending physician.  Recommendations may be updated based on patient status, additional functional criteria and insurance authorization.    Follow Up Recommendations  No OT follow up    Assistance Recommended at Discharge PRN  Patient can return home with the following  Assistance with cooking/housework;Assist for transportation   Equipment Recommendations  None recommended by OT    Recommendations for Other Services      Precautions / Restrictions Precautions Precautions: Other (comment) Precaution Comments: watch HR Restrictions Weight Bearing Restrictions: No       Mobility Bed Mobility               General bed mobility comments: Educating on log roll techniques    Transfers Overall transfer level: Modified independent                 General transfer comment: Performing sit<>stand with increased time. No  assistance needed     Balance Overall balance assessment: No apparent balance deficits (not formally assessed)                                         ADL either performed or assessed with clinical judgement   ADL Overall ADL's : Modified independent                                       General ADL Comments: Pt performing ADLs and functional mobility at Mod I level with increased time as needed. Providing education on compensatory techniques for ADLs to decrease pain at L chest including oral care, UB ADLs, LB ADLs, and transfers. Pt performing toileting, grooming, and mobility in hallway. Provided handout and education on energy conservation technqiues for ADLs and IADLs. Pt verbalized understanding.    Extremity/Trunk Assessment Upper Extremity Assessment Upper Extremity Assessment: Overall WFL for tasks assessed   Lower Extremity Assessment Lower Extremity Assessment: Defer to PT evaluation        Vision       Perception     Praxis      Cognition Arousal/Alertness: Awake/alert Behavior During Therapy: WFL for tasks assessed/performed Overall Cognitive Status: Within Functional Limits for tasks assessed  Exercises      Shoulder Instructions       General Comments HR elevating to 130-140s with activity    Pertinent Vitals/ Pain       Pain Assessment Pain Assessment: Faces Faces Pain Scale: Hurts little more Pain Location: L-side abdomen (thoracotomy incision) with coughs Pain Descriptors / Indicators: Discomfort, Grimacing, Guarding Pain Intervention(s): Monitored during session, Limited activity within patient's tolerance, Repositioned  Home Living                                          Prior Functioning/Environment              Frequency  Min 2X/week        Progress Toward Goals  OT Goals(current goals can now be found in the care  plan section)  Progress towards OT goals: Progressing toward goals  Acute Rehab OT Goals OT Goal Formulation: With patient Time For Goal Achievement: 08/20/21 Potential to Achieve Goals: Good ADL Goals Pt Will Perform Grooming: Independently;standing;sitting Pt Will Perform Lower Body Dressing: Independently;sit to/from stand Pt Will Transfer to Toilet: Independently;ambulating;regular height toilet  Plan Discharge plan remains appropriate    Co-evaluation                 AM-PAC OT "6 Clicks" Daily Activity     Outcome Measure   Help from another person eating meals?: None Help from another person taking care of personal grooming?: A Little Help from another person toileting, which includes using toliet, bedpan, or urinal?: A Little Help from another person bathing (including washing, rinsing, drying)?: A Little Help from another person to put on and taking off regular upper body clothing?: A Little Help from another person to put on and taking off regular lower body clothing?: A Little 6 Click Score: 19    End of Session    OT Visit Diagnosis: Unsteadiness on feet (R26.81);Pain Pain - part of body: Leg   Activity Tolerance Patient limited by pain   Patient Left in bed;with call bell/phone within reach;with family/visitor present   Nurse Communication Mobility status        Time: 4098-1191 OT Time Calculation (min): 20 min  Charges: OT General Charges $OT Visit: 1 Visit OT Treatments $Self Care/Home Management : 8-22 mins  Della Scrivener MSOT, OTR/L Acute Rehab Office: [phone]  Theodoro Grist Pamela Intrieri 08/10/2021, 11:03 AM

## 2021-08-10 NOTE — TOC Transition Note (Addendum)
Transition of Care [facility]) - CM/SW Discharge Note   Patient Details  Name: Tracy Garcia MRN: [id number] Date of Birth: [birth year]  Transition of Care [facility]) CM/SW Contact:  Leone Haven, RN Phone Number: 08/10/2021, 1:08 PM   Clinical Narrative:    Patient is for dc today,  NCM checked with Staff RN , Shanda Bumps, she states she does not think patient received a coupon for the xarelto.  She is to start the xarelto on 7/7.  NCM informed Staff RN to have secretary to mail the 30 day and the 10 copay coupon to the patient. Shanda Bumps states she has the coupons and will have Diplomatic Services operational officer to mail them to her if she needs them.         Patient Goals and CMS Choice        Discharge Placement                       Discharge Plan and Services                                     Social Determinants of Health (SDOH) Interventions     Readmission Risk Interventions     No data to display

## 2021-08-11 ENCOUNTER — Emergency Department (HOSPITAL_COMMUNITY)
Admission: EM | Admit: 2021-08-11 | Discharge: 2021-08-12 | Disposition: A | Discharge status: Home / Self Care | Payer: 59 | Source: Home / Self Care | Attending: Emergency Medicine | Admitting: Emergency Medicine

## 2021-08-11 ENCOUNTER — Emergency Department (HOSPITAL_COMMUNITY): Payer: 59

## 2021-08-11 ENCOUNTER — Encounter (HOSPITAL_COMMUNITY): Payer: Self-pay | Admitting: Emergency Medicine

## 2021-08-11 ENCOUNTER — Emergency Department (HOSPITAL_COMMUNITY)
Admission: EM | Admit: 2021-08-11 | Discharge: 2021-08-11 | Disposition: A | Discharge status: Home / Self Care | Payer: 59 | Attending: Emergency Medicine | Admitting: Emergency Medicine

## 2021-08-11 ENCOUNTER — Other Ambulatory Visit: Payer: Self-pay

## 2021-08-11 DIAGNOSIS — R0602 Shortness of breath: Secondary | ICD-10-CM | POA: Diagnosis not present

## 2021-08-11 DIAGNOSIS — R0789 Other chest pain: Secondary | ICD-10-CM | POA: Diagnosis present

## 2021-08-11 DIAGNOSIS — N938 Other specified abnormal uterine and vaginal bleeding: Secondary | ICD-10-CM | POA: Insufficient documentation

## 2021-08-11 DIAGNOSIS — R000 Tachycardia, unspecified: Secondary | ICD-10-CM | POA: Insufficient documentation

## 2021-08-11 DIAGNOSIS — Z7901 Long term (current) use of anticoagulants: Secondary | ICD-10-CM | POA: Insufficient documentation

## 2021-08-11 DIAGNOSIS — N939 Abnormal uterine and vaginal bleeding, unspecified: Secondary | ICD-10-CM

## 2021-08-11 DIAGNOSIS — R079 Chest pain, unspecified: Secondary | ICD-10-CM

## 2021-08-11 DIAGNOSIS — R062 Wheezing: Secondary | ICD-10-CM | POA: Diagnosis not present

## 2021-08-11 DIAGNOSIS — G4700 Insomnia, unspecified: Secondary | ICD-10-CM | POA: Insufficient documentation

## 2021-08-11 LAB — BASIC METABOLIC PANEL
Anion gap: 10 (ref 5–15)
Anion gap: 11 (ref 5–15)
BUN: 12 mg/dL (ref 6–20)
BUN: 18 mg/dL (ref 6–20)
CO2: 21 mmol/L — ABNORMAL LOW (ref 22–32)
CO2: 21 mmol/L — ABNORMAL LOW (ref 22–32)
Calcium: 9.2 mg/dL (ref 8.9–10.3)
Calcium: 9.5 mg/dL (ref 8.9–10.3)
Chloride: 105 mmol/L (ref 98–111)
Chloride: 106 mmol/L (ref 98–111)
Creatinine, Ser: 0.64 mg/dL (ref 0.44–1.00)
Creatinine, Ser: 0.56 mg/dL (ref 0.44–1.00)
GFR, Estimated: >60 mL/min (ref >60)
GFR, Estimated: >60 mL/min (ref >60)
Glucose, Bld: 112 mg/dL — ABNORMAL HIGH (ref 70–99)
Glucose, Bld: 112 mg/dL — ABNORMAL HIGH (ref 70–99)
Potassium: 4.2 mmol/L (ref 3.5–5.1)
Potassium: 4.3 mmol/L (ref 3.5–5.1)
Sodium: 136 mmol/L (ref 135–145)
Sodium: 138 mmol/L (ref 135–145)

## 2021-08-11 LAB — CBC
HCT: 42.8 % (ref 36.0–46.0)
Hemoglobin: 14.3 g/dL (ref 12.0–15.0)
MCH: 31.8 pg (ref 26.0–34.0)
MCHC: 33.4 g/dL (ref 30.0–36.0)
MCV: 95.1 fL (ref 80.0–100.0)
Platelets: 439 10*3/uL — ABNORMAL HIGH (ref 150–400)
RBC: 4.5 MIL/uL (ref 3.87–5.11)
RDW: 13.3 % (ref 11.5–15.5)
WBC: 14.5 10*3/uL — ABNORMAL HIGH (ref 4.0–10.5)
nRBC: 0 % (ref 0.0–0.2)

## 2021-08-11 LAB — CBC WITH DIFFERENTIAL/PLATELET
Abs Immature Granulocytes: 0.62 10*3/uL — ABNORMAL HIGH (ref 0.00–0.07)
Basophils Absolute: 0.1 10*3/uL (ref 0.0–0.1)
Basophils Relative: 1 %
Eosinophils Absolute: 0.4 10*3/uL (ref 0.0–0.5)
Eosinophils Relative: 3 %
HCT: 38.2 % (ref 36.0–46.0)
Hemoglobin: 13.2 g/dL (ref 12.0–15.0)
Immature Granulocytes: 4 %
Lymphocytes Relative: 20 %
Lymphs Abs: 2.8 10*3/uL (ref 0.7–4.0)
MCH: 31.7 pg (ref 26.0–34.0)
MCHC: 34.6 g/dL (ref 30.0–36.0)
MCV: 91.8 fL (ref 80.0–100.0)
Monocytes Absolute: 1.2 10*3/uL — ABNORMAL HIGH (ref 0.1–1.0)
Monocytes Relative: 8 %
Neutro Abs: 9.1 10*3/uL — ABNORMAL HIGH (ref 1.7–7.7)
Neutrophils Relative %: 64 %
Platelets: 381 10*3/uL (ref 150–400)
RBC: 4.16 MIL/uL (ref 3.87–5.11)
RDW: 13.3 % (ref 11.5–15.5)
WBC: 14.2 10*3/uL — ABNORMAL HIGH (ref 4.0–10.5)
nRBC: 0 % (ref 0.0–0.2)

## 2021-08-11 LAB — I-STAT BETA HCG BLOOD, ED (MC, WL, AP ONLY): I-stat hCG, quantitative: <5 m[IU]/mL (ref <5)

## 2021-08-11 LAB — LACTIC ACID, PLASMA: Lactic Acid, Venous: 0.7 mmol/L (ref 0.5–1.9)

## 2021-08-11 MED ORDER — IPRATROPIUM-ALBUTEROL 0.5-2.5 (3) MG/3ML IN SOLN
3.0000 mL | Freq: Once | RESPIRATORY_TRACT | Status: AC
  Administered 2021-08-11: 3 mL via RESPIRATORY_TRACT
  Filled 2021-08-11: qty 3

## 2021-08-11 MED ORDER — RIVAROXABAN 15 MG PO TABS
15.0000 mg | ORAL_TABLET | Freq: Every day | ORAL | Status: DC
  Administered 2021-08-11: 15 mg via ORAL
  Filled 2021-08-11: qty 1

## 2021-08-11 MED ORDER — RIVAROXABAN 15 MG PO TABS: 15.0000 mg | ORAL_TABLET | Freq: Two times a day (BID) | ORAL | 0 refills | Status: DC

## 2021-08-11 MED ORDER — IOHEXOL 350 MG/ML SOLN
70.0000 mL | Freq: Once | INTRAVENOUS | Status: AC | PRN
  Administered 2021-08-11: 70 mL via INTRAVENOUS

## 2021-08-11 NOTE — ED Provider Triage Note (Addendum)
Emergency Medicine Provider Triage Evaluation Note  Tracy Garcia , a 36 y.o. female  was evaluated in triage.  Pt complains of vaginal bleeding.  Patient was recently admitted on 07/30/2021 for acute pulmonary embolism and had VATS performed on 6/19.  She was placed on Xarelto since.  She began spotting on her period yesterday but today noticed large clots.  She also noticed right lower abdominal cramping which are normal for her menstrual cycle.  She usually does not have heavy bleeding during her cycle and heavy bleeding while on the blood thinner prompted her visit to the emergency department today.  She was using the NuvaRing as a form of contraceptive up until her hospitalization where a physician told her that it could have caused her blood clot so she removed it.  This is her first menstrual cycle without the NuvaRing.  She denies change in chest pain from earlier this morning, shortness of breath, abdominal pain, nausea/vomiting/diarrhea, urinary symptoms, change in bowel habits, fever.  Review of Systems  Positive: See above Negative:   Physical Exam  BP (!) 134/100   Pulse (!) 111   Temp 98.7 F (37.1 C) (Oral)   Resp 16   LMP 07/30/2021 (Exact Date)   SpO2 91%  Gen:   Awake, no distress   Resp:  Normal effort  MSK:   Moves extremities without difficulty  Other:    Medical Decision Making  Medically screening exam initiated at 7:21 PM.  Appropriate orders placed.  Tracy Garcia was informed that the remainder of the evaluation will be completed by another provider, this initial triage assessment does not replace that evaluation, and the importance of remaining in the ED until their evaluation is complete.  Laboratory studies performed earlier today.   Tracy Garcia, [place] 08/11/21 1924    Tracy Garcia, [place] 08/11/21 1924

## 2021-08-11 NOTE — ED Triage Notes (Signed)
Patient reports heavy menstrual bleeding today , no abdominal cramping , she is taking Xarelto .

## 2021-08-11 NOTE — ED Provider Notes (Signed)
[facility] EMERGENCY DEPARTMENT Provider Note   CSN: [id number] Arrival date & time: 08/11/21  0505     History  Chief Complaint  Patient presents with   Chest Pain   Shortness of Breath    Tracy Garcia is a 36 y.o. female.  HPI     This is a 36 year old female who presents with shortness of breath and chest tightness.  Patient reports that she was watching TV at home approximately 1 hour ago when she had sudden onset of chest tightness and shortness of breath.  She was discharged from the hospital yesterday after being admitted for PE with subsequent pulmonary infarct and pleural effusion.  Patient ended up with a VATS procedure.  She was discharged on Augmentin.  She was under the understanding that she was not to restart her Xarelto until July 7; however, I have reviewed her chart and it appears that the intention was for her to be on Xarelto 15 mg twice daily until July 6 and then transition to 20 mg once daily on July 7.  She did take medications while in the hospital yesterday but did not take any anticoagulants last night.  She is currently on Augmentin.  She has not had any recurrent fevers.  Chart reviewed including prior discharge summary.  Patient discharged with Augmentin.  Intent for her to be on 15 mg twice daily Xarelto currently.  Home Medications Prior to Admission medications   Medication Sig Start Date End Date Taking? Authorizing Provider  albuterol (VENTOLIN HFA) 108 (90 Base) MCG/ACT inhaler Inhale 2 puffs into the lungs every 6 (six) hours as needed for wheezing or shortness of breath. 08/10/21   Amin, Loura Halt, MD  amoxicillin-clavulanate (AUGMENTIN) 875-125 MG tablet Take 1 tablet by mouth 2 (two) times daily for 14 days. 08/10/21 08/24/21  Amin, Ankit Chirag, MD  NURTEC 75 MG TBDP Take 1 tablet by mouth daily as needed (pain). 04/29/21   [provider]  NUVARING 0.12-0.015 MG/24HR vaginal ring Place 1 each vaginally every 28  (twenty-eight) days. 04/18/21   [provider]  oxyCODONE (OXY IR/ROXICODONE) 5 MG immediate release tablet Take 1 tablet (5 mg total) by mouth every 6 (six) hours as needed for breakthrough pain. 08/10/21   Amin, Loura Halt, MD  pantoprazole (PROTONIX) 40 MG tablet Take 1 tablet (40 mg total) by mouth daily. 08/10/21   Amin, Loura Halt, MD  polyethylene glycol (MIRALAX / GLYCOLAX) 17 g packet Take 17 g by mouth daily as needed for mild constipation or moderate constipation. 08/10/21   Amin, Loura Halt, MD  rivaroxaban (XARELTO) 20 MG TABS tablet Take 1 tablet (20 mg total) by mouth daily with supper. 08/21/21   Amin, Loura Halt, MD  rosuvastatin (CRESTOR) 20 MG tablet Take 20 mg by mouth at bedtime. 07/20/21   [provider]      Allergies    Shellfish allergy    Review of Systems   Review of Systems  Constitutional:  Negative for fever.  Respiratory:  Positive for shortness of breath.   Cardiovascular:  Positive for chest pain and leg swelling.  All other systems reviewed and are negative.   Physical Exam Updated Vital Signs BP 123/74 (BP Location: Right Arm)   Pulse 80   Temp 98.2 F (36.8 C) (Oral)   Resp (!) 32   Ht 1.829 m (6')   LMP 07/30/2021 (Exact Date)   SpO2 96%   BMI 36.48 kg/m  Physical Exam Vitals and nursing  note reviewed.  Constitutional:      Appearance: She is well-developed. She is obese. She is not ill-appearing.  HENT:     Head: Normocephalic and atraumatic.  Eyes:     Pupils: Pupils are equal, round, and reactive to light.  Cardiovascular:     Rate and Rhythm: Normal rate and regular rhythm.     Heart sounds: Normal heart sounds.  Pulmonary:     Effort: Tachypnea present. No respiratory distress.     Breath sounds: No wheezing.     Comments: Diminished breath sounds in all lung fields, occasional wheeze noted Abdominal:     General: Bowel sounds are normal.     Palpations: Abdomen is soft.  Musculoskeletal:     Cervical back:  Neck supple.  Skin:    General: Skin is warm and dry.  Neurological:     Mental Status: She is alert and oriented to person, place, and time.  Psychiatric:        Mood and Affect: Mood normal.     ED Results / Procedures / Treatments   Labs (all labs ordered are listed, but only abnormal results are displayed) Labs Reviewed  CBC WITH DIFFERENTIAL/PLATELET  BASIC METABOLIC PANEL  LACTIC ACID, PLASMA  LACTIC ACID, PLASMA  I-STAT BETA HCG BLOOD, ED ([facility], [facility], [facility] ONLY)    EKG None  Radiology DG Chest 2 View  Result Date: 08/10/2021 CLINICAL DATA:  Empyema EXAM: CHEST - 2 VIEW COMPARISON:  08/09/2021 FINDINGS: Cardiomediastinal silhouette and pulmonary vasculature are within normal limits. Unchanged elevation of left hemidiaphragm with adjacent airspace opacity and likely small effusion. Right lung remains well aerated. IMPRESSION: Unchanged elevation of left hemidiaphragm with adjacent airspace opacity and likely small pleural effusion. Electronically Signed   By: Acquanetta Belling M.D.   On: 08/10/2021 08:10   DG CHEST PORT 1 VIEW  Result Date: 08/09/2021 CLINICAL DATA:  Empyema.  Chest pain and shortness of breath. EXAM: PORTABLE CHEST 1 VIEW COMPARISON:  08/09/2021 FINDINGS: There has been interval removal of the left-sided chest tubes. No pneumothorax identified. Left pleural effusion with overlying left lower lung opacity is again noted, stable to improved in the interval. Right lung appears clear. Visualized osseous structures are unremarkable. IMPRESSION: 1. Stable to improved left pleural effusion with overlying left lower lung opacity. 2. Interval removal of left chest tubes. No pneumothorax identified. Electronically Signed   By: Signa Kell M.D.   On: 08/09/2021 17:34   DG CHEST PORT 1 VIEW  Result Date: 08/09/2021 CLINICAL DATA:  Empyema EXAM: PORTABLE CHEST 1 VIEW COMPARISON:  August 08, 2021 FINDINGS: The right IJ has been removed. 2 left chest tubes remain. No pneumothorax.  The cardiomediastinal silhouette and right lung are unchanged. Opacity and effusion in the left base is similar. No other changes. IMPRESSION: 1. Opacity and underlying effusion on the left is stable. 2. Stable left chest tubes.  No pneumothorax. 3. No other changes. Electronically Signed   By: Gerome Sam III M.D.   On: 08/09/2021 08:19    Procedures Procedures    Medications Ordered in ED Medications - No data to display  ED Course/ Medical Decision Making/ A&P Clinical Course as of 08/12/21 0017  Tue Aug 11, 2021  0650 Spoke with pharmacy.  Indicate last Xarelto dosing was 15 mg yesterday morning.  Pharmacist agrees that the patient should remain on twice daily dosing until 7/6 and should transition to once daily his dosing on 7/7. [CH]    Clinical Course User  Index [CH] Olivette Beckmann, Mayer Masker, MD                           Medical Decision Making Amount and/or Complexity of Data Reviewed Labs: ordered. Radiology: ordered.  Risk Prescription drug management.   This patient presents to the ED for concern of shortness of breath, this involves an extensive number of treatment options, and is a complaint that carries with it a high risk of complications and morbidity.  I considered the following differential and admission for this acute, potentially life threatening condition.  The differential diagnosis includes recurrent effusion, increased clot burden/PE, pneumonia, reactive airway disease  MDM:    This is a 36 year old female with recent hospital course notable for pulmonary infarct and effusion with resultant VATS procedure.  Patient was discharged just yesterday.  She is nontoxic.  Vital signs are reassuring.  She is not in any respiratory distress.  She did not take her nightly dose of Xarelto.  There was an apparent misunderstanding regarding ongoing Xarelto dosing.  I have confirmed that she does need to be on 15 mg twice daily until 7/6.  Chest x-ray is stable from discharge.   Lab work-up is largely reassuring.  Will repeat CT scan given complexity of her case.  I have provided patient with a prescription for twice daily dosing of Xarelto.  (Labs, imaging, consults)  Labs: I Ordered, and personally interpreted labs.  The pertinent results include: CBC, BMP  Imaging Studies ordered: I ordered imaging studies including chest x-ray, CT pending I independently visualized and interpreted imaging. I agree with the radiologist interpretation  Additional history obtained from significant other at bedside.  External records from outside source obtained and reviewed including discharge summary  Cardiac Monitoring: The patient was maintained on a cardiac monitor.  I personally viewed and interpreted the cardiac monitored which showed an underlying rhythm of: Normal sinus rhythm  Reevaluation: After the interventions noted above, I reevaluated the patient and found that they have :stayed the same  Social Determinants of Health: Lives independently  Disposition: Pending, signed out to oncoming provider  Co morbidities that complicate the patient evaluation  Past Medical History:  Diagnosis Date   Anxiety    Depression    DVT, lower extremity (HCC) 07/29/2021   RIGHT   High cholesterol    Migraine    Tremor of both hands      Medicines Meds ordered this encounter  Medications   ipratropium-albuterol (DUONEB) 0.5-2.5 (3) MG/3ML nebulizer solution 3 mL   Rivaroxaban (XARELTO) 15 MG TABS tablet    Sig: Take 1 tablet (15 mg total) by mouth 2 (two) times daily with a meal for 10 days. Start Xarelto 20 mg daily on 08/21/2021.    Dispense:  20 tablet    Refill:  0   iohexol (OMNIPAQUE) 350 MG/ML injection 70 mL   DISCONTD: Rivaroxaban (XARELTO) tablet 15 mg    I have reviewed the patients home medicines and have made adjustments as needed  Problem List / ED Course: Problem List Items Addressed This Visit       Other   Chest pain - Primary                 Final Clinical Impression(s) / ED Diagnoses Final diagnoses:  None    Rx / DC Orders ED Discharge Orders     None         Sundy Houchins, Mayer Masker, MD 08/12/21 0020

## 2021-08-12 MED ORDER — HYDROXYZINE HCL 25 MG PO TABS
50.0000 mg | ORAL_TABLET | Freq: Once | ORAL | Status: AC
  Administered 2021-08-12: 50 mg via ORAL
  Filled 2021-08-12: qty 2

## 2021-08-12 MED ORDER — HYDROXYZINE HCL 25 MG PO TABS: 25.0000 mg | ORAL_TABLET | Freq: Every evening | ORAL | 0 refills | Status: DC | PRN

## 2021-08-12 NOTE — ED Provider Notes (Signed)
[facility] EMERGENCY DEPARTMENT Provider Note   CSN: [id number] Arrival date & time: 08/11/21  1823     History  Chief Complaint  Patient presents with   Heavy Menstrual Period    Tracy Garcia is a 36 y.o. female.  HPI     This is a 36 year old female with recent history of PE, pulmonary infarct, pleural effusion with VATS procedure on Xarelto who presents with heavy vaginal bleeding.  Patient reports that she removed her NuvaRing after being in the hospital given the concern for PE and hormonal birth control.  She states she had onset of vaginal bleeding after leaving the emergency room this morning.  She describes passage of small clots.  She states "I have never had that before."  She states that she is going through a pad every 1-1/2 to 2 hours.  Denies abdominal pain.  No dizziness.  Has not had any vaginal trauma or sexual intercourse since onset of bleeding.  Patient also states that she has had difficulty sleeping since discharge from the hospital.  She is requesting something for sleep.  Home Medications Prior to Admission medications   Medication Sig Start Date End Date Taking? Authorizing Provider  albuterol (VENTOLIN HFA) 108 (90 Base) MCG/ACT inhaler Inhale 2 puffs into the lungs every 6 (six) hours as needed for wheezing or shortness of breath. 08/10/21   Amin, Loura Halt, MD  amoxicillin-clavulanate (AUGMENTIN) 875-125 MG tablet Take 1 tablet by mouth 2 (two) times daily for 14 days. Patient taking differently: Take 1 tablet by mouth See admin instructions. Bid x 14 days 08/10/21 08/24/21  Amin, Loura Halt, MD  NURTEC 75 MG TBDP Take 1 tablet by mouth daily as needed (pain). 04/29/21   [provider]  NUVARING 0.12-0.015 MG/24HR vaginal ring Place 1 each vaginally every 28 (twenty-eight) days. 04/18/21   [provider]  oxyCODONE (OXY IR/ROXICODONE) 5 MG immediate release tablet Take 1 tablet (5 mg total) by mouth every 6 (six) hours as  needed for breakthrough pain. 08/10/21   Amin, Loura Halt, MD  pantoprazole (PROTONIX) 40 MG tablet Take 1 tablet (40 mg total) by mouth daily. 08/10/21   Amin, Loura Halt, MD  polyethylene glycol (MIRALAX / GLYCOLAX) 17 g packet Take 17 g by mouth daily as needed for mild constipation or moderate constipation. 08/10/21   Amin, Loura Halt, MD  Rivaroxaban (XARELTO) 15 MG TABS tablet Take 1 tablet (15 mg total) by mouth 2 (two) times daily with a meal for 10 days. Start Xarelto 20 mg daily on 08/21/2021. 08/11/21 08/21/21  Erum Cercone, Mayer Masker, MD  rivaroxaban (XARELTO) 20 MG TABS tablet Take 1 tablet (20 mg total) by mouth daily with supper. 08/21/21   Amin, Loura Halt, MD  rosuvastatin (CRESTOR) 20 MG tablet Take 20 mg by mouth at bedtime. 07/20/21   [provider]      Allergies    Shellfish allergy    Review of Systems   Review of Systems  Genitourinary:  Positive for vaginal bleeding. Negative for dysuria.  All other systems reviewed and are negative.   Physical Exam Updated Vital Signs BP 126/86 (BP Location: Right Arm)   Pulse (!) 101   Temp 99 F (37.2 C) (Oral)   Resp 17   LMP 07/30/2021 (Exact Date)   SpO2 94%  Physical Exam Vitals and nursing note reviewed.  Constitutional:      Appearance: She is well-developed. She is obese.     Comments: Chronically ill-appearing, nontoxic  HENT:     Head: Normocephalic and atraumatic.  Eyes:     Pupils: Pupils are equal, round, and reactive to light.  Cardiovascular:     Rate and Rhythm: Regular rhythm. Tachycardia present.     Heart sounds: Normal heart sounds.  Pulmonary:     Effort: Pulmonary effort is normal. No respiratory distress.     Breath sounds: No wheezing.  Abdominal:     Palpations: Abdomen is soft.     Tenderness: There is no abdominal tenderness.  Musculoskeletal:     Cervical back: Neck supple.     Right lower leg: No edema.     Left lower leg: No edema.  Skin:    General: Skin is warm and dry.   Neurological:     Mental Status: She is alert and oriented to person, place, and time.  Psychiatric:        Mood and Affect: Mood normal.     ED Results / Procedures / Treatments   Labs (all labs ordered are listed, but only abnormal results are displayed) Labs Reviewed  BASIC METABOLIC PANEL - Abnormal; Notable for the following components:      Result Value   CO2 21 (*)    Glucose, Bld 112 (*)    All other components within normal limits  CBC - Abnormal; Notable for the following components:   WBC 14.5 (*)    Platelets 439 (*)    All other components within normal limits    EKG None  Radiology CT Angio Chest PE W and/or Wo Contrast  Result Date: 08/11/2021 CLINICAL DATA:  PE, return for Shortness of breath. EXAM: CT ANGIOGRAPHY CHEST WITH CONTRAST TECHNIQUE: Multidetector CT imaging of the chest was performed using the standard protocol during bolus administration of intravenous contrast. Multiplanar CT image reconstructions and MIPs were obtained to evaluate the vascular anatomy. RADIATION DOSE REDUCTION: This exam was performed according to the departmental dose-optimization program which includes automated exposure control, adjustment of the mA and/or kV according to patient size and/or use of iterative reconstruction technique. CONTRAST:  37mL OMNIPAQUE IOHEXOL 350 MG/ML SOLN COMPARISON:  08/02/2021 FINDINGS: Cardiovascular: Known recent pulmonary emboli involving lobar and segmental branches in the left lower lobe. Degree of clot at the left lower lobar bifurcation is mildly diminished. Occlusive clot is still seen in the posterior basilar segment of the right lower lobe. Minimal clot seen in the posterior basilar segment of the right lower lobe. No new or progressive clot is noted. No pulmonary artery enlargement. Normal heart size. No acute aortic finding. Mediastinum/Nodes: Negative for hematoma. Mild hilar adenopathy on the left, expected. Lungs/Pleura: Essentially resolved  complicated left pleural effusion with small volume pneumothorax in the setting of recent VATS and chest tube. Subpleural opacity in the left lower lobe compatible with resolving pneumonia and infarct. Upper Abdomen: Negative Musculoskeletal: No acute finding Review of the MIP images confirms the above findings. IMPRESSION: 1. No interval pulmonary embolism. Mildly decreased clot burden in the left lower lobe pulmonary artery. Occlusive clot is still seen within left lower lobe segmental branches. 2. Interval VATS with no significant remaining pleural fluid. Improving left pulmonary aeration. Electronically Signed   By: Tiburcio Pea M.D.   On: 08/11/2021 07:53   DG Chest Portable 1 View  Result Date: 08/11/2021 CLINICAL DATA:  36 year old female with history of shortness of breath. Recent VATS. EXAM: PORTABLE CHEST 1 VIEW COMPARISON:  Chest x-ray 08/10/2021. FINDINGS: Low lung volumes. Elevation of the left hemidiaphragm. Small left  pleural effusion. Patchy airspace consolidation throughout the left mid to lower lung. Right lung is clear. No definite pneumothorax. No evidence of pulmonary edema. Heart size is normal. Upper mediastinal contours are within normal limits. IMPRESSION: 1. Persistent low lung volumes with atelectasis and/or consolidation in the left mid to lower lung and small left pleural effusion. Electronically Signed   By: Trudie Reed M.D.   On: 08/11/2021 06:20   DG Chest 2 View  Result Date: 08/10/2021 CLINICAL DATA:  Empyema EXAM: CHEST - 2 VIEW COMPARISON:  08/09/2021 FINDINGS: Cardiomediastinal silhouette and pulmonary vasculature are within normal limits. Unchanged elevation of left hemidiaphragm with adjacent airspace opacity and likely small effusion. Right lung remains well aerated. IMPRESSION: Unchanged elevation of left hemidiaphragm with adjacent airspace opacity and likely small pleural effusion. Electronically Signed   By: Acquanetta Belling M.D.   On: 08/10/2021 08:10     Procedures Procedures    Medications Ordered in ED Medications  hydrOXYzine (ATARAX) tablet 50 mg (has no administration in time range)    ED Course/ Medical Decision Making/ A&P                           Medical Decision Making Risk Prescription drug management.   This patient presents to the ED for concern of heavy vaginal bleeding, this involves an extensive number of treatment options, and is a complaint that carries with it a high risk of complications and morbidity.  I considered the following differential and admission for this acute, potentially life threatening condition.  The differential diagnosis includes menorrhagia, anemia, bleeding secondary to Xarelto  MDM:    This is a 36 year old female who presents with heavy vaginal bleeding after removing her NuvaRing.  She is currently on Xarelto.  She is otherwise asymptomatic.  She was seen by myself this morning and appears stable and unchanged.  Heart rate is around 100.  She is not hypotensive.  Repeat lab work when compared to this morning with stable hemoglobin at greater than 14.  Discussed with the patient that it is not surprising that she is having more heavy bleeding on Xarelto.  Given that she is hemodynamically stable with a stable hemoglobin, she would not need intervention at this time although she needs to keep a close eye on her bleeding.  Recommend repeat hemoglobin in 2 to 3 days if she continues to have heavy bleeding.  We discussed bleeding precautions.  Given that she has not had any recent sexual activity and her bleeding is likely secondary to removal of hormonal birth control, pelvic exam was deferred based on preference of patient.  Have low suspicion for vaginal tear or other source of bleeding that could be intervened upon.  Patient is requesting something for sleep.  She was given Atarax.  She has follow-up with her primary physician next week.  Recommend repeat hemoglobin and discussion regarding  alternatives for management of menstrual period.  (Labs, imaging, consults)  Labs: I Ordered, and personally interpreted labs.  The pertinent results include: CBC, BMP  Imaging Studies ordered: I ordered imaging studies including N/A I independently visualized and interpreted imaging. I agree with the radiologist interpretation  Additional history obtained from significant other at bedside.  External records from outside source obtained and reviewed including discharge summaries from previous hospitalization  Cardiac Monitoring: The patient was maintained on a cardiac monitor.  I personally viewed and interpreted the cardiac monitored which showed an underlying rhythm of: Normal sinus  rhythm  Reevaluation: After the interventions noted above, I reevaluated the patient and found that they have :stayed the same  Social Determinants of Health: Lives independently  Disposition: Discharge  Co morbidities that complicate the patient evaluation  Past Medical History:  Diagnosis Date   Anxiety    Depression    DVT, lower extremity (HCC) 07/29/2021   RIGHT   High cholesterol    Migraine    Tremor of both hands      Medicines Meds ordered this encounter  Medications   hydrOXYzine (ATARAX) tablet 50 mg    I have reviewed the patients home medicines and have made adjustments as needed  Problem List / ED Course: Problem List Items Addressed This Visit   None Visit Diagnoses     Episode of heavy vaginal bleeding    -  Primary                   Final Clinical Impression(s) / ED Diagnoses Final diagnoses:  None    Rx / DC Orders ED Discharge Orders     None         Shon Baton, MD 08/12/21 [phone]

## 2021-08-12 NOTE — Discharge Instructions (Signed)
You were seen today for increased uterine bleeding.  This is not surprising given your Xarelto usage.  If you begin to soak through more than 1 pad per hour, begin to feel lightheaded or develop any new or worsening symptoms, you should be reevaluated.  Otherwise have your hemoglobin rechecked by your primary physician in 1 week.  You may discuss with your primary physician alternatives for birth control and menstrual management.  Take Atarax as needed for insomnia.

## 2021-08-21 NOTE — Progress Notes (Unsigned)
[address]       [place] [address]             [phone]    HPI: Patient returns for routine postoperative follow-up having undergone Left VATS with drainage of empyema and decortication on 08/03/2021.  Since discharge the patient has been to the ED on multiple times for chest pain and heavy vaginal bleeding.  She is on Xarelto.  Since hospital discharge the patient reports she is doing okay.  She continues to cough up just clear sputum.  She is trying to quit smoking and is currently utilizing a nicotine patch.  However she states her mother smokes 3 ppd.  She continues to use her IS/Flutter valve without difficulty.  She does have some expected numbness around her surgical incisions.  She questions when she can take a shower as her PCP told her to sponge bath until she was evaluated by our office.  Current Outpatient Medications  Medication Sig Dispense Refill   albuterol (VENTOLIN HFA) 108 (90 Base) MCG/ACT inhaler Inhale 2 puffs into the lungs every 6 (six) hours as needed for wheezing or shortness of breath. 8 g 2   amoxicillin-clavulanate (AUGMENTIN) 875-125 MG tablet Take 1 tablet by mouth 2 (two) times daily for 14 days. (Patient taking differently: Take 1 tablet by mouth See admin instructions. Bid x 14 days) 28 tablet 0   hydrOXYzine (ATARAX) 25 MG tablet Take 1 tablet (25 mg total) by mouth at bedtime as needed. 12 tablet 0   NURTEC 75 MG TBDP Take 1 tablet by mouth daily as needed (pain).     NUVARING 0.12-0.015 MG/24HR vaginal ring Place 1 each vaginally every 28 (twenty-eight) days.     oxyCODONE (OXY IR/ROXICODONE) 5 MG immediate release tablet Take 1 tablet (5 mg total) by mouth every 6 (six) hours as needed for breakthrough pain. 10 tablet 0   pantoprazole (PROTONIX) 40 MG tablet Take 1 tablet (40 mg total) by mouth daily. 30 tablet 0   polyethylene glycol (MIRALAX / GLYCOLAX) 17 g packet Take 17 g by mouth daily as needed for mild constipation or  moderate constipation. 14 each 0   Rivaroxaban (XARELTO) 15 MG TABS tablet Take 1 tablet (15 mg total) by mouth 2 (two) times daily with a meal for 10 days. Start Xarelto 20 mg daily on 08/21/2021. 20 tablet 0   rivaroxaban (XARELTO) 20 MG TABS tablet Take 1 tablet (20 mg total) by mouth daily with supper. 30 tablet    rosuvastatin (CRESTOR) 20 MG tablet Take 20 mg by mouth at bedtime.     No current facility-administered medications for this visit.    Physical Exam:  BP 134/87   Pulse (!) 110   Temp 98.4 F (36.9 C) (Oral)   Resp 20   Ht 6' (1.829 m)   Wt 250 lb (113.4 kg)   LMP 07/30/2021 (Exact Date)   SpO2 96% Comment: RA  BMI 33.91 kg/m   Gen: NAD Heart: RRR, tachy Lungs: diminished bibasilar Incisions clean and dry, chest tube sutures are in place  Diagnostic Tests:  CXR: post surgical changes, opacification remains present with some residual pleural effusion  A/P:  S/P Left VATS for drainage of empyema, decortication- chest tube sutures removed without difficulty, incisions are scabbed but no evidence of infection present.. patient given instruction okay to shower Pulm- continued use of IS/Flutter valve, patient is trying to quit smoking and I commended her and encouraged her to  continue her effort ID- afebrile, mild tachycardia, she has completed course of Augmentin Post VATs neuralgia, not unexpected RTC prn, she was instructed should she develop fever, green sputum to get evaluated by PCP  Lowella Dandy, PA-C [facility] [phone]

## 2021-08-24 ENCOUNTER — Ambulatory Visit: Payer: 59

## 2021-08-25 ENCOUNTER — Other Ambulatory Visit: Payer: Self-pay | Admitting: Cardiothoracic Surgery

## 2021-08-25 DIAGNOSIS — J90 Pleural effusion, not elsewhere classified: Secondary | ICD-10-CM

## 2021-08-26 ENCOUNTER — Ambulatory Visit (INDEPENDENT_AMBULATORY_CARE_PROVIDER_SITE_OTHER): Payer: Self-pay | Admitting: Physician Assistant

## 2021-08-26 ENCOUNTER — Ambulatory Visit
Admission: RE | Admit: 2021-08-26 | Discharge: 2021-08-26 | Disposition: A | Discharge status: Home / Self Care | Payer: 59 | Source: Ambulatory Visit | Attending: Cardiothoracic Surgery | Admitting: Cardiothoracic Surgery

## 2021-08-26 VITALS — BP 134/87 | HR 110 | Temp 98.4°F | Resp 20 | Ht 72.0 in | Wt 250.0 lb

## 2021-08-26 DIAGNOSIS — J90 Pleural effusion, not elsewhere classified: Secondary | ICD-10-CM

## 2021-08-26 DIAGNOSIS — Z09 Encounter for follow-up examination after completed treatment for conditions other than malignant neoplasm: Secondary | ICD-10-CM

## 2022-04-08 ENCOUNTER — Encounter (HOSPITAL_COMMUNITY): Payer: Self-pay | Admitting: Emergency Medicine

## 2022-04-08 ENCOUNTER — Emergency Department (HOSPITAL_COMMUNITY)
Admission: EM | Admit: 2022-04-08 | Discharge: 2022-04-08 | Disposition: A | Discharge status: Home / Self Care | Payer: BLUE CROSS/BLUE SHIELD | Attending: Emergency Medicine | Admitting: Emergency Medicine

## 2022-04-08 ENCOUNTER — Other Ambulatory Visit: Payer: Self-pay

## 2022-04-08 DIAGNOSIS — Z7901 Long term (current) use of anticoagulants: Secondary | ICD-10-CM | POA: Insufficient documentation

## 2022-04-08 DIAGNOSIS — K047 Periapical abscess without sinus: Secondary | ICD-10-CM | POA: Insufficient documentation

## 2022-04-08 DIAGNOSIS — Z5900 Homelessness unspecified: Secondary | ICD-10-CM | POA: Insufficient documentation

## 2022-04-08 DIAGNOSIS — K0889 Other specified disorders of teeth and supporting structures: Secondary | ICD-10-CM | POA: Diagnosis present

## 2022-04-08 MED ORDER — ACETAMINOPHEN 500 MG PO TABS: 1000.0000 mg | ORAL_TABLET | Freq: Three times a day (TID) | ORAL | 0 refills | Status: AC | PRN

## 2022-04-08 MED ORDER — OXYCODONE HCL 5 MG PO TABS: 5.0000 mg | ORAL_TABLET | Freq: Four times a day (QID) | ORAL | 0 refills | Status: AC | PRN

## 2022-04-08 MED ORDER — AMOXICILLIN-POT CLAVULANATE 875-125 MG PO TABS: 1.0000 | ORAL_TABLET | Freq: Two times a day (BID) | ORAL | 0 refills | Status: AC

## 2022-04-08 NOTE — ED Provider Notes (Signed)
[facility] Provider Note   CSN: [id number] Arrival date & time: 04/08/22  1159     History  Chief Complaint  Patient presents with   Dental Pain    Tracy Garcia is a 37 y.o. 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   Patient c/o right lower dental pain x 2 weeks worsening over the last 3 days. Feels similar to prior dental infections. Has been using tylenol, anbesol, listerine without relief. Decreased sleep d/t pain. +subjective fevers. Denies trismus, difficulty breathing/swallowing. No tongue or lip swelling.  Patient c/o pain to right side of face as well without swelling. Does take xarelto for h/o DVT/PE.  Currently, note of homelessness and patient states she has no financial resources at the moment which is why she has not yet been to a dentist.   Dental Pain      Home Medications Prior to Admission medications   Medication Sig Start Date End Date Taking? Authorizing Provider  acetaminophen (TYLENOL) 500 MG tablet Take 2 tablets (1,000 mg total) by mouth every 8 (eight) hours as needed for up to 14 days for mild pain, moderate pain, fever or headache. 04/08/22 04/22/22 Yes Audley Hose, MD  amoxicillin-clavulanate (AUGMENTIN) 875-125 MG tablet Take 1 tablet by mouth every 12 (twelve) hours for 10 days. 04/08/22 04/18/22 Yes Audley Hose, MD  oxyCODONE (ROXICODONE) 5 MG immediate release tablet Take 1 tablet (5 mg total) by mouth every 6 (six) hours as needed for severe pain. 04/08/22  Yes Schutt, Grafton Folk, PA-C  albuterol (VENTOLIN HFA) 108 (90 Base) MCG/ACT inhaler Inhale 2 puffs into the lungs every 6 (six) hours as needed for wheezing or shortness of breath. 08/10/21   Amin, Jeanella Flattery, MD  hydrOXYzine (ATARAX) 25 MG tablet Take 1 tablet (25 mg total) by mouth at bedtime as needed. Patient taking differently: Take 10 mg by mouth 2 (two) times daily. 08/12/21   Horton,  Barbette Hair, MD  NURTEC 75 MG TBDP Take 1 tablet by mouth daily as needed (pain). 04/29/21   [provider]  NUVARING 0.12-0.015 MG/24HR vaginal ring Place 1 each vaginally every 28 (twenty-eight) days. 04/18/21   [provider]  pantoprazole (PROTONIX) 40 MG tablet Take 1 tablet (40 mg total) by mouth daily. 08/10/21   Amin, Jeanella Flattery, MD  polyethylene glycol (MIRALAX / GLYCOLAX) 17 g packet Take 17 g by mouth daily as needed for mild constipation or moderate constipation. 08/10/21   Amin, Jeanella Flattery, MD  rivaroxaban (XARELTO) 20 MG TABS tablet Take 1 tablet (20 mg total) by mouth daily with supper. 08/21/21   Amin, Jeanella Flattery, MD  rosuvastatin (CRESTOR) 20 MG tablet Take 20 mg by mouth at bedtime. 07/20/21   [provider]      Allergies    Shellfish allergy    Review of Systems   Review of Systems Review of systems Negative for SOB.  A 10 point review of systems was performed and is negative unless otherwise reported in HPI.  Physical Exam Updated Vital Signs BP (!) 140/76 (BP Location: Left Arm)   Pulse 79   Temp 98.4 F (36.9 C) (Oral)   Resp 16   Ht 6' (1.829 m)   Wt 117.9 kg   SpO2 100%   BMI 35.26 kg/m  Physical Exam General: Normal appearing female, lying in bed.  HEENT: PERRLA, Sclera anicteric, MMM, trachea midline.  NCAT.  No notable facial tongue palate or lip swelling. TTP of R mandibular region without palpated abnormalities. She has very poor dentition with multiple caries noted multiple teeth on exam.  Tooth #28 is missing however the tooth #29 and area of 28 are very tender to palpation.  She has no notable areas of induration fluctuance erythema or swelling intraorally. Posterior oropharynx is clear, free of swelling.  No neck swelling. Cardiology: RRR, no murmurs/rubs/gallops.  Resp: Normal respiratory rate and effort. No stridor.  Abd: Soft, non-tender, non-distended. No rebound tenderness or guarding.  MSK:  Extremities without  deformity or TTP. No cyanosis or clubbing. Skin: warm, dry.  Neuro: A&Ox4, CNs II-XII grossly intact. MAEs. Sensation grossly intact.  Psych: Normal mood and affect.   ED Results / Procedures / Treatments   Labs (all labs ordered are listed, but only abnormal results are displayed) Labs Reviewed - No data to display  EKG None  Radiology No results found.  Procedures Procedures    Medications Ordered in ED Medications - No data to display  ED Course/ Medical Decision Making/ A&P                          Medical Decision Making Risk OTC drugs. Prescription drug management.   MDM:    Patient complains of right lower dental pain on exam has very poor dentition.  She has no facial swelling or neck swelling to indicate facial abscess or Ludwig's angina.  On exam she has very poor dentition and this likely represents a dental infection or abscess.  Patient states it feels similar to the ones she has had prior.  Patient does endorse subjective fever however on triage she is vitally stable and she is very well-appearing.  I have no concern for serious systemic infection such as bacteremia.  Do not believe CT imaging is required at this time.  There is no drainable abscess noted intraorally on my exam.  Patient will be given Augmentin twice daily for 10 days as well as oxycodone a few pills for pain control.  Patient is also instructed to take 1 g of Tylenol every 8 hours.  Patient is given resources including low-cost dental options as well as financial assistance and social services.  Patient is given extensive discharge instructions and return precautions, all questions answered to patient satisfaction.      Social Determinants of Health: Patient lives independently   Disposition:  DC w/ discharge instructions/return precautions. All questions answered to patient's satisfaction.    Co morbidities that complicate the patient evaluation  Past Medical History:  Diagnosis Date    Anxiety    Depression    DVT, lower extremity ([place]) 07/29/2021   RIGHT   High cholesterol    Migraine    Tremor of both hands      Medicines Meds ordered this encounter  Medications   amoxicillin-clavulanate (AUGMENTIN) 875-125 MG tablet    Sig: Take 1 tablet by mouth every 12 (twelve) hours for 10 days.    Dispense:  14 tablet    Refill:  0   acetaminophen (TYLENOL) 500 MG tablet    Sig: Take 2 tablets (1,000 mg total) by mouth every 8 (eight) hours as needed for up to 14 days for mild pain, moderate pain, fever or headache.    Dispense:  45 tablet    Refill:  0   oxyCODONE (ROXICODONE) 5 MG immediate release tablet    Sig: Take 1 tablet (5  mg total) by mouth every 6 (six) hours as needed for severe pain.    Dispense:  10 tablet    Refill:  0    I have reviewed the patients home medicines and have made adjustments as needed  Problem List / ED Course: Problem List Items Addressed This Visit   None Visit Diagnoses     Dental infection    -  Primary                   This note was created using dictation software, which may contain spelling or grammatical errors.    Audley Hose, MD 04/08/22 [phone]

## 2022-04-08 NOTE — ED Triage Notes (Signed)
Patient c/o right lower dental pain x 2 weeks. Patient c/o pain to right side of face.

## 2022-04-08 NOTE — Discharge Instructions (Addendum)
Thank you for coming to [facility] Emergency Department. You were seen for dental pain. We did an exam, and thi showed likely a dental infection.  We have prescribed Augmentin and antibiotic to take twice a day for 10 days.  This may help your symptoms and may even improve the infection however the definitive treatment for this is to see a dentist.  We have attached low-cost dental resource sheet for you to call and schedule an appointment. We have also provided financial assistance and social services resource sheets. Please take 1,000 mg tylenol every 8 hours and we have provided some oxycodone to take for breakthrough pain every 4-6 hours.  Please follow up with your primary care provider within 1 week.   Do not hesitate to return to the ED or call 911 if you experience: -Worsening symptoms -Facial swelling -Difficulty swallowing, breathing -Lightheadedness, passing out -Fevers/chills -Anything else that concerns you

## 2022-04-11 ENCOUNTER — Other Ambulatory Visit: Payer: Self-pay | Admitting: Nurse Practitioner

## 2022-05-03 ENCOUNTER — Other Ambulatory Visit: Payer: Self-pay | Admitting: Nurse Practitioner

## 2022-06-02 ENCOUNTER — Other Ambulatory Visit: Payer: Self-pay | Admitting: Nurse Practitioner

## 2022-08-17 ENCOUNTER — Other Ambulatory Visit: Payer: Self-pay | Admitting: Nurse Practitioner

## 2022-08-24 ENCOUNTER — Other Ambulatory Visit: Payer: Self-pay | Admitting: Nurse Practitioner

## 2022-09-17 ENCOUNTER — Other Ambulatory Visit: Payer: Self-pay | Admitting: Family

## 2022-10-21 ENCOUNTER — Other Ambulatory Visit: Payer: Self-pay

## 2022-10-28 ENCOUNTER — Other Ambulatory Visit: Payer: Self-pay

## 2022-11-01 ENCOUNTER — Other Ambulatory Visit: Payer: Self-pay | Admitting: Nurse Practitioner

## 2022-11-01 MED ORDER — ROSUVASTATIN CALCIUM 20 MG PO TABS: 20.0000 mg | ORAL_TABLET | Freq: Every day | ORAL | 1 refills | Status: AC

## 2023-03-18 IMAGING — DX DG CHEST 1V PORT
1 series · 1 of 1 positions shown · non-contrast
Comparison: Previous studies including the examination done earlier
today

CLINICAL DATA: Left pleural effusion

EXAM:
PORTABLE CHEST 1 VIEW

[chest]
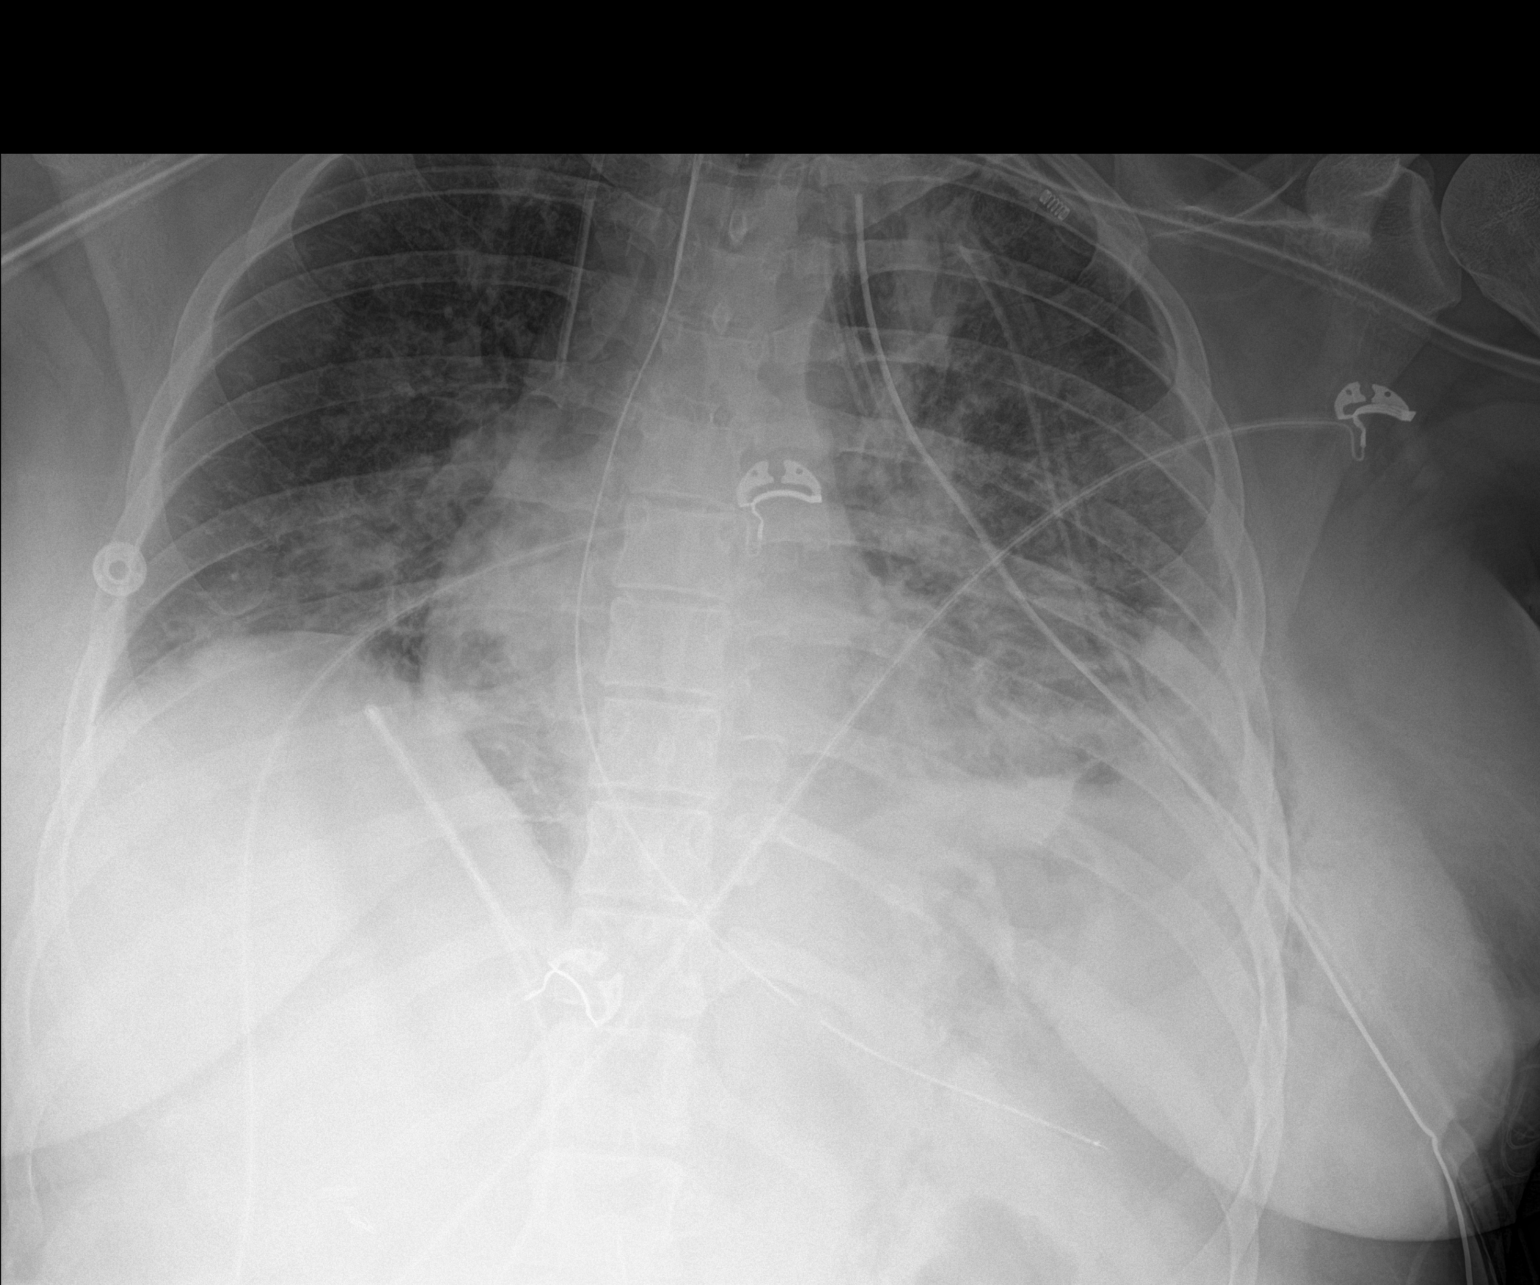

[1 of 1 positions shown; findings below may reference images not displayed]

FINDINGS: Transverse diameter of heart is within normal limits. There is
interval decrease in left pleural effusion. There is interval
placement of left chest tube. There is possible second left chest
tube in the lateral aspect of the left hemithorax. There is no
demonstrable pneumothorax. Subcutaneous emphysema is seen in the
left chest wall. Tip of endotracheal tube is 3.9 cm above the
carina. Tip enteric tube is seen in the stomach. Tip of right IJ
central venous catheter is seen in the superior vena cava. Central
pulmonary vessels are prominent. Increased interstitial markings are
seen in the parahilar regions and lower lung fields.
IMPRESSION: There is significant interval decrease in left pleural effusion
after placement of left chest tube. There is no pneumothorax.
Central pulmonary vessels are prominent along with prominence of
interstitial markings in the mid and lower lung fields suggesting
possible CHF.

## 2023-03-18 IMAGING — DX DG CHEST 1V PORT
1 series · 1 of 1 positions shown · non-contrast
Comparison: Chest CT from one day prior. 01/22/2011 chest
radiograph.

CLINICAL DATA: Left hemothorax

EXAM:
PORTABLE CHEST 1 VIEW

[chest]
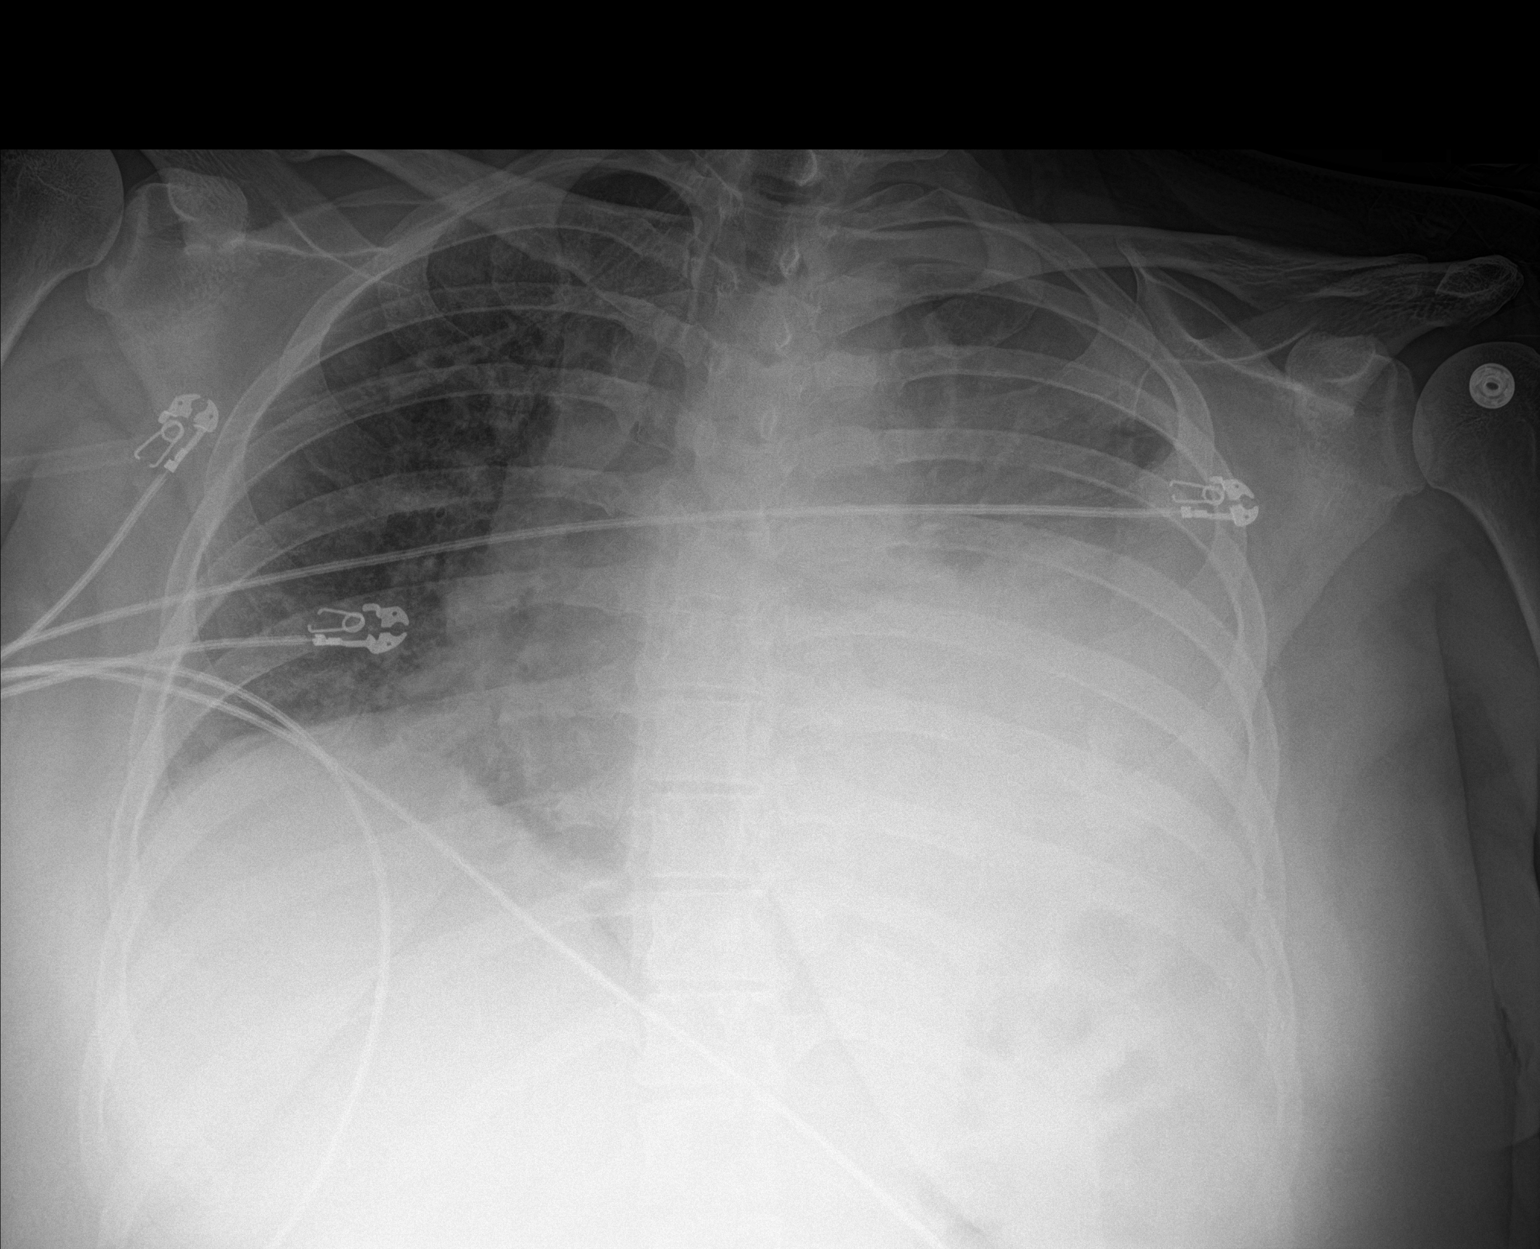

[1 of 1 positions shown; findings below may reference images not displayed]

FINDINGS: Stable cardiomediastinal silhouette with normal heart size. No
pneumothorax. Low lung volumes. Moderate left pleural effusion is
stable. No right pleural effusion. Dense patchy consolidation
throughout the mid to lower left lung is unchanged. Mild right
basilar atelectasis is stable.
IMPRESSION: 1. Stable moderate left pleural effusion.
2. Stable dense patchy consolidation throughout the mid to lower
left lung.
3. Stable mild right basilar atelectasis.

## 2023-03-19 IMAGING — DX DG CHEST 1V PORT
1 series · 1 of 1 positions shown · non-contrast
Comparison: 08/03/2021

CLINICAL DATA: Post VATS, PE

EXAM:
PORTABLE CHEST 1 VIEW

[chest ap]
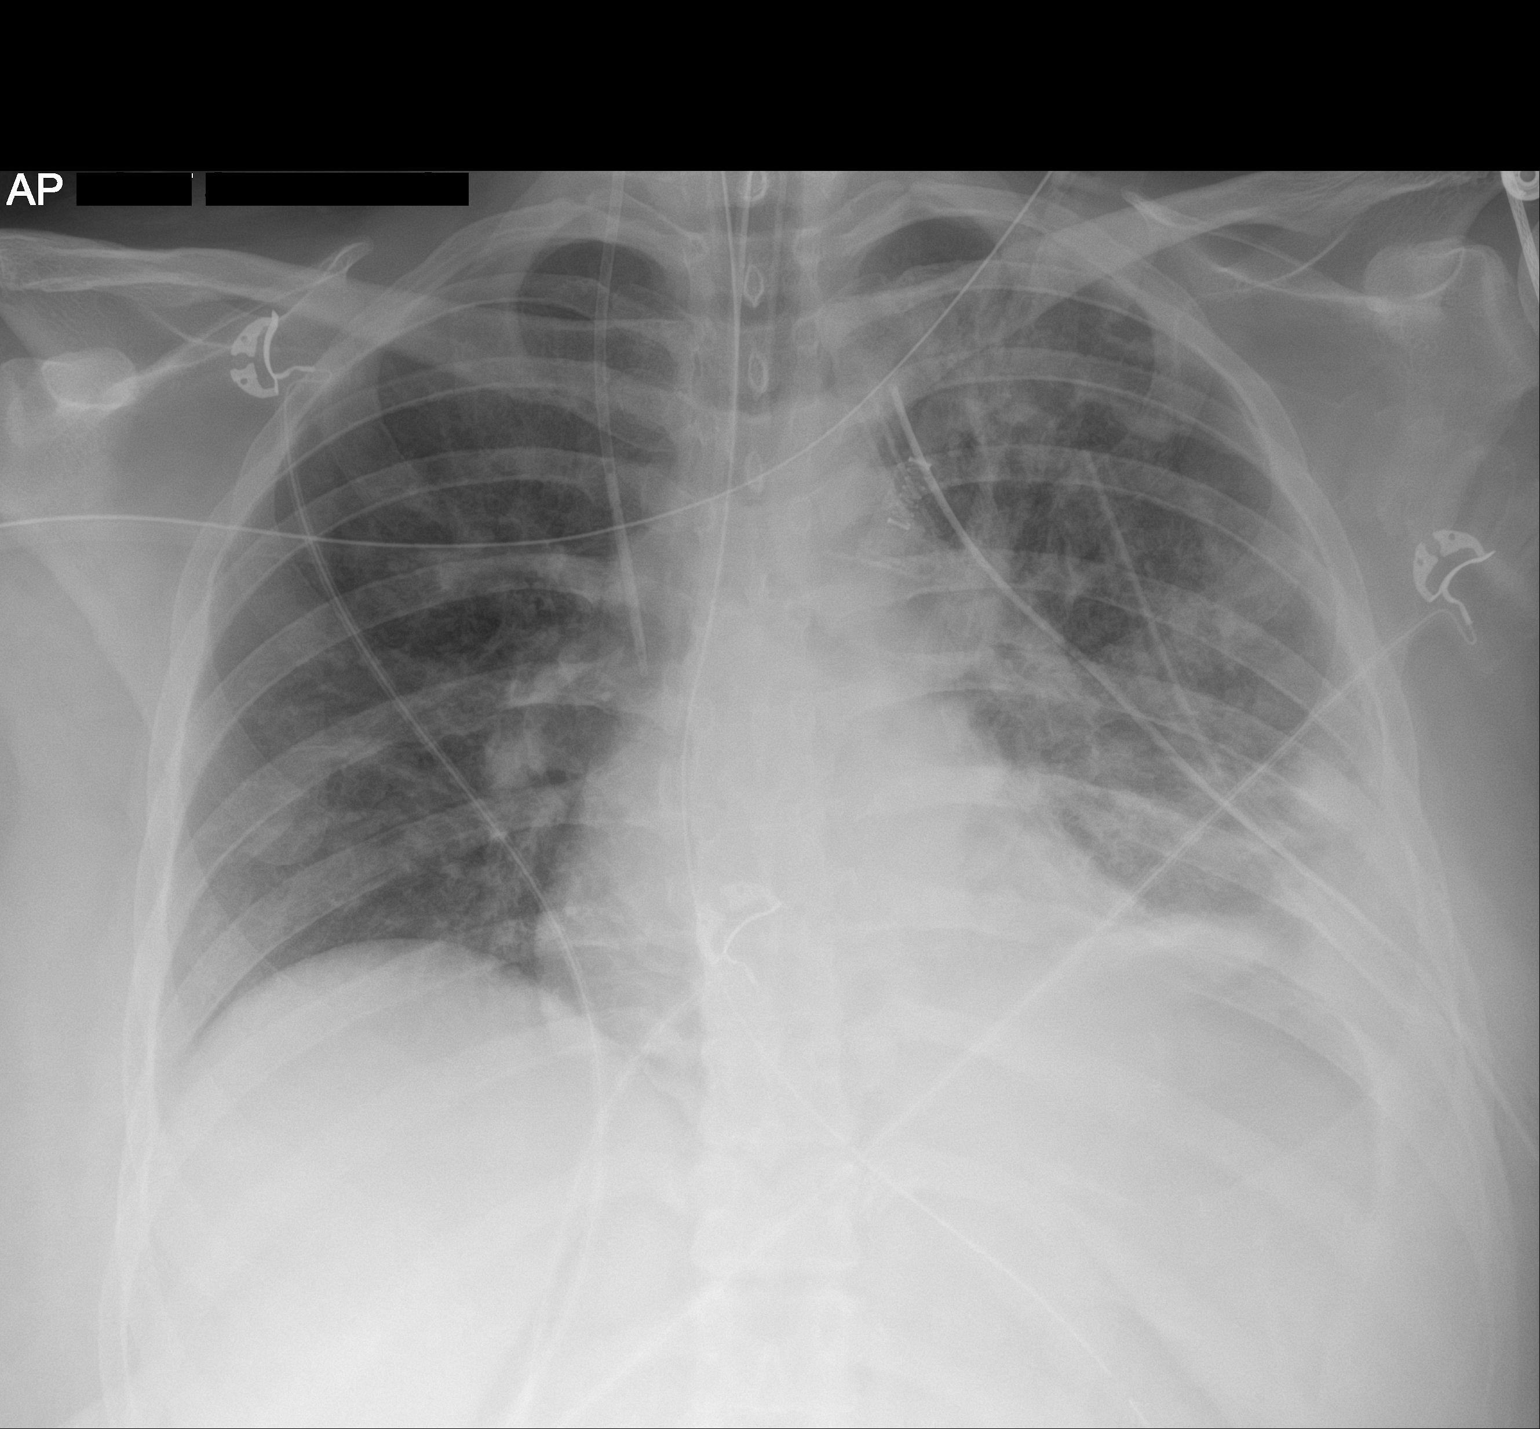

[1 of 1 positions shown; findings below may reference images not displayed]

FINDINGS: No significant change in AP portable chest radiograph. Heterogeneous
airspace opacity of the left lung and small left pleural effusion.
Left-sided chest tubes are in position with no significant residual
pneumothorax. Support apparatus including endotracheal tube,
esophagogastric tube, and right neck vascular catheter remain in
unchanged, appropriate position.
IMPRESSION: 1. No significant change in AP portable chest radiograph.
Heterogeneous airspace opacity of the left lung and small left
pleural effusion.
2. Left-sided chest tubes are in position with no significant
residual pneumothorax.
3. Unchanged support apparatus.

## 2023-03-20 IMAGING — DX DG CHEST 1V PORT
1 series · 1 of 1 positions shown · non-contrast
Comparison: Prior chest radiograph 08/04/2021 and earlier. CT
angiogram chest 08/02/2021.
COMPARISON: Prior chest radiograph 08/04/2021 and earlier. CT
angiogram chest 08/02/2021.

Addendum:
CLINICAL DATA: Provided history: Post VATS, acute PE.

EXAM:
PORTABLE CHEST 1 VIEW

[chest ap]
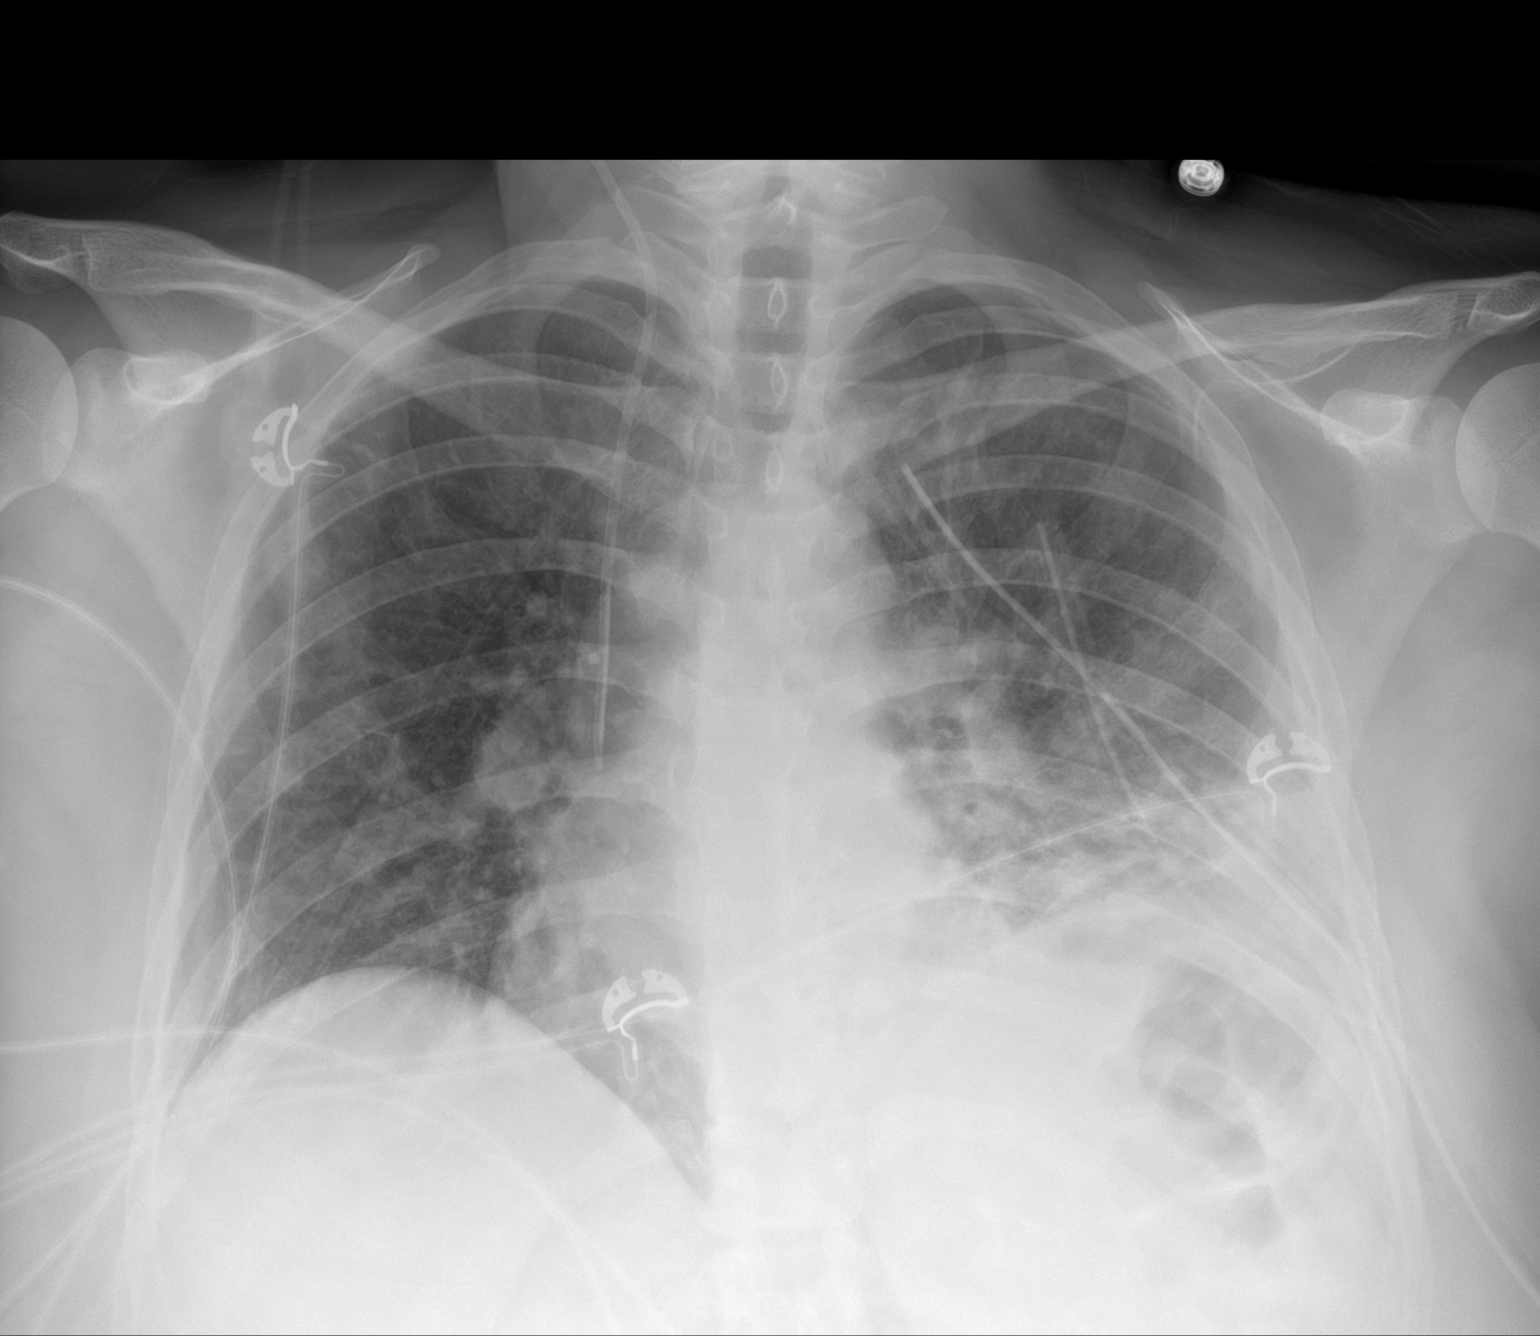

[1 of 1 positions shown; findings below may reference images not displayed]

FINDINGS: Interval extubation. Interval removal of a previously demonstrated
enteric tube. Unchanged position of a right IJ approach central
venous catheter with tip projecting at the at the level of the lower
SVC. Unchanged position of 2 left-sided chest tubes.

Stable cardiomediastinal silhouette. Irregular opacities within the
left mid to lower lung field, which may reflect atelectasis and/or
consolidation, slightly increased. Small left pleural effusion.
Trace left pneumothorax, new from the prior exam. No appreciable
airspace consolidation within the right lung.
IMPRESSION: Interval extubation and enteric tube removal.

Small left apical pneumothorax, new from prior exams (in the
presence 2 left-sided chest tubes).

Irregular opacities within the left mid-to-lower lung field have
increased, and may reflect atelectasis and/or consolidation.

Persistent small left pleural effusion.

ADDENDUM:
Impression #2 called by telephone at the time of interpretation on
08/05/2021 at [DATE] to provider Dr. Hamrol, who verbally
acknowledged these results.

*** End of Addendum ***
FINDINGS: Interval extubation. Interval removal of a previously demonstrated
enteric tube. Unchanged position of a right IJ approach central
venous catheter with tip projecting at the at the level of the lower
SVC. Unchanged position of 2 left-sided chest tubes.

Stable cardiomediastinal silhouette. Irregular opacities within the
left mid to lower lung field, which may reflect atelectasis and/or
consolidation, slightly increased. Small left pleural effusion.
Trace left pneumothorax, new from the prior exam. No appreciable
airspace consolidation within the right lung.
IMPRESSION: Interval extubation and enteric tube removal.

Small left apical pneumothorax, new from prior exams (in the
presence 2 left-sided chest tubes).

Irregular opacities within the left mid-to-lower lung field have
increased, and may reflect atelectasis and/or consolidation.

Persistent small left pleural effusion.

## 2023-03-21 IMAGING — DX DG CHEST 1V PORT
1 series · 1 of 1 positions shown · non-contrast
Comparison: Chest radiograph 1 day prior

CLINICAL DATA: Status post VATS

EXAM:
PORTABLE CHEST 1 VIEW

[chest ap]
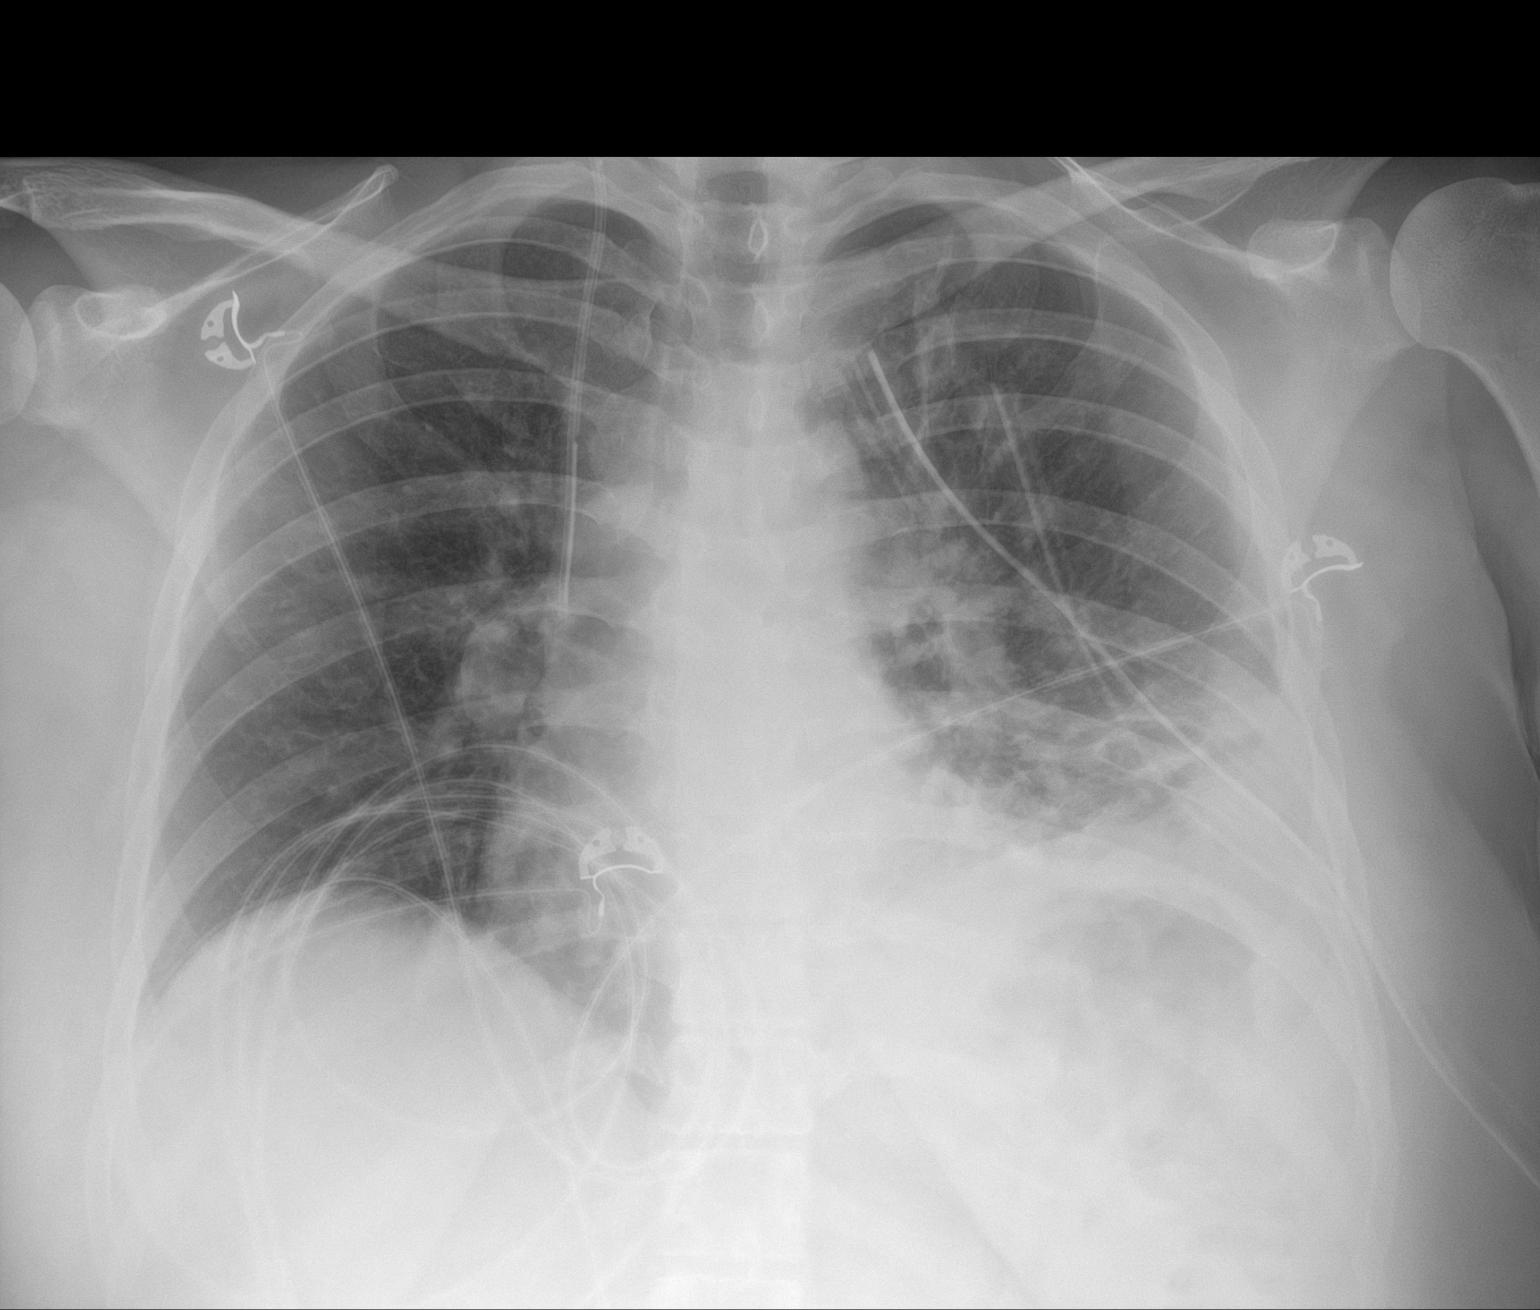

[1 of 1 positions shown; findings below may reference images not displayed]

FINDINGS: The right IJ vascular catheter tip terminates in the lower SVC. Two
left-sided chest tubes remain in place.

The cardiomediastinal silhouette is stable.

A small left pleural effusion and adjacent patchy opacities are not
significantly changed. The previously seen left pneumothorax is no
longer appreciated. The right lung remains clear. There is no
significant right effusion. There is no right pneumothorax.

The bones are stable.
IMPRESSION: 1. The previously seen small left pneumothorax is no longer
identified.
2. Unchanged small left pleural effusion and adjacent left basilar
opacities.
